# Patient Record
Sex: Female | Born: 1960
Health system: Southern US, Community
[De-identification: ages and names within clinical notes are randomized; demographics above are authoritative.]

## PROBLEM LIST (undated history)

## (undated) DIAGNOSIS — E7800 Pure hypercholesterolemia, unspecified: Secondary | ICD-10-CM

## (undated) DIAGNOSIS — I639 Cerebral infarction, unspecified: Secondary | ICD-10-CM

## (undated) DIAGNOSIS — J4520 Mild intermittent asthma, uncomplicated: Secondary | ICD-10-CM

## (undated) DIAGNOSIS — I10 Essential (primary) hypertension: Secondary | ICD-10-CM

## (undated) DIAGNOSIS — M1990 Unspecified osteoarthritis, unspecified site: Secondary | ICD-10-CM

## (undated) DIAGNOSIS — G4730 Sleep apnea, unspecified: Secondary | ICD-10-CM

## (undated) DIAGNOSIS — L509 Urticaria, unspecified: Secondary | ICD-10-CM

## (undated) DIAGNOSIS — K219 Gastro-esophageal reflux disease without esophagitis: Secondary | ICD-10-CM

## (undated) DIAGNOSIS — E079 Disorder of thyroid, unspecified: Secondary | ICD-10-CM

## (undated) DIAGNOSIS — M549 Dorsalgia, unspecified: Secondary | ICD-10-CM

## (undated) HISTORY — PX: KNEE SURGERY: SHX244

## (undated) HISTORY — DX: Urticaria, unspecified: L50.9

## (undated) HISTORY — DX: Mild intermittent asthma, uncomplicated: J45.20

## (undated) HISTORY — PX: ABDOMINAL HYSTERECTOMY: SHX81

## (undated) HISTORY — PX: CARPAL TUNNEL RELEASE: SHX101

## (undated) HISTORY — PX: NASAL SINUS SURGERY: SHX719

---

## 2006-04-07 ENCOUNTER — Encounter: Admission: RE | Admit: 2006-04-07 | Discharge: 2006-04-07 | Payer: Self-pay | Admitting: Internal Medicine

## 2008-03-02 ENCOUNTER — Emergency Department (HOSPITAL_BASED_OUTPATIENT_CLINIC_OR_DEPARTMENT_OTHER): Admission: EM | Admit: 2008-03-02 | Discharge: 2008-03-02 | Payer: Self-pay | Admitting: Emergency Medicine

## 2008-03-30 ENCOUNTER — Emergency Department (HOSPITAL_BASED_OUTPATIENT_CLINIC_OR_DEPARTMENT_OTHER): Admission: EM | Admit: 2008-03-30 | Discharge: 2008-03-30 | Payer: Self-pay | Admitting: Emergency Medicine

## 2008-04-07 ENCOUNTER — Emergency Department (HOSPITAL_BASED_OUTPATIENT_CLINIC_OR_DEPARTMENT_OTHER): Admission: EM | Admit: 2008-04-07 | Discharge: 2008-04-07 | Payer: Self-pay | Admitting: Emergency Medicine

## 2008-05-26 ENCOUNTER — Emergency Department (HOSPITAL_BASED_OUTPATIENT_CLINIC_OR_DEPARTMENT_OTHER): Admission: EM | Admit: 2008-05-26 | Discharge: 2008-05-26 | Payer: Self-pay | Admitting: Emergency Medicine

## 2008-07-30 ENCOUNTER — Emergency Department (HOSPITAL_BASED_OUTPATIENT_CLINIC_OR_DEPARTMENT_OTHER): Admission: EM | Admit: 2008-07-30 | Discharge: 2008-07-30 | Payer: Self-pay | Admitting: Emergency Medicine

## 2008-09-08 ENCOUNTER — Ambulatory Visit: Payer: Self-pay | Admitting: Radiology

## 2008-09-08 ENCOUNTER — Emergency Department (HOSPITAL_BASED_OUTPATIENT_CLINIC_OR_DEPARTMENT_OTHER): Admission: EM | Admit: 2008-09-08 | Discharge: 2008-09-08 | Payer: Self-pay | Admitting: Emergency Medicine

## 2009-01-12 ENCOUNTER — Emergency Department (HOSPITAL_BASED_OUTPATIENT_CLINIC_OR_DEPARTMENT_OTHER): Admission: EM | Admit: 2009-01-12 | Discharge: 2009-01-12 | Payer: Self-pay | Admitting: Emergency Medicine

## 2009-01-12 ENCOUNTER — Ambulatory Visit: Payer: Self-pay | Admitting: Diagnostic Radiology

## 2009-02-17 ENCOUNTER — Emergency Department (HOSPITAL_BASED_OUTPATIENT_CLINIC_OR_DEPARTMENT_OTHER): Admission: EM | Admit: 2009-02-17 | Discharge: 2009-02-17 | Payer: Self-pay | Admitting: Emergency Medicine

## 2009-03-22 ENCOUNTER — Emergency Department (HOSPITAL_COMMUNITY): Admission: EM | Admit: 2009-03-22 | Discharge: 2009-03-22 | Payer: Self-pay | Admitting: Emergency Medicine

## 2009-05-11 ENCOUNTER — Emergency Department (HOSPITAL_BASED_OUTPATIENT_CLINIC_OR_DEPARTMENT_OTHER): Admission: EM | Admit: 2009-05-11 | Discharge: 2009-05-11 | Payer: Self-pay | Admitting: Emergency Medicine

## 2009-05-11 ENCOUNTER — Ambulatory Visit: Payer: Self-pay | Admitting: Diagnostic Radiology

## 2009-07-04 ENCOUNTER — Emergency Department (HOSPITAL_BASED_OUTPATIENT_CLINIC_OR_DEPARTMENT_OTHER): Admission: EM | Admit: 2009-07-04 | Discharge: 2009-07-04 | Payer: Self-pay | Admitting: Emergency Medicine

## 2009-07-04 ENCOUNTER — Ambulatory Visit: Payer: Self-pay | Admitting: Diagnostic Radiology

## 2009-07-17 ENCOUNTER — Ambulatory Visit (HOSPITAL_BASED_OUTPATIENT_CLINIC_OR_DEPARTMENT_OTHER): Admission: RE | Admit: 2009-07-17 | Discharge: 2009-07-17 | Payer: Self-pay | Admitting: Orthopedic Surgery

## 2009-07-17 ENCOUNTER — Ambulatory Visit: Payer: Self-pay | Admitting: Diagnostic Radiology

## 2009-07-30 ENCOUNTER — Encounter: Admission: RE | Admit: 2009-07-30 | Discharge: 2009-10-03 | Payer: Self-pay | Admitting: Orthopedic Surgery

## 2010-05-03 ENCOUNTER — Emergency Department (HOSPITAL_BASED_OUTPATIENT_CLINIC_OR_DEPARTMENT_OTHER): Admission: EM | Admit: 2010-05-03 | Discharge: 2010-05-03 | Payer: Self-pay | Admitting: Emergency Medicine

## 2010-11-03 LAB — POCT CARDIAC MARKERS
CKMB, poc: <1 ng/mL — ABNORMAL LOW (ref 1.0–8.0)
Myoglobin, poc: 104 ng/mL (ref 12–200)
Myoglobin, poc: 131 ng/mL (ref 12–200)
Troponin i, poc: <0.05 ng/mL (ref 0.00–0.09)
Troponin i, poc: <0.05 ng/mL (ref 0.00–0.09)

## 2010-11-03 LAB — DIFFERENTIAL
Basophils Relative: 1 % (ref 0–1)
Lymphs Abs: 2.1 10*3/uL (ref 0.7–4.0)
Monocytes Absolute: 0.6 10*3/uL (ref 0.1–1.0)
Monocytes Relative: 7 % (ref 3–12)
Neutro Abs: 5.3 10*3/uL (ref 1.7–7.7)
Neutrophils Relative %: 66 % (ref 43–77)

## 2010-11-03 LAB — BASIC METABOLIC PANEL
GFR calc Af Amer: >60 mL/min (ref >60)
Potassium: 3.7 mEq/L (ref 3.5–5.1)

## 2010-11-03 LAB — CBC: Hemoglobin: 12.7 g/dL (ref 12.0–15.0)

## 2010-11-11 LAB — CBC
HCT: 35.8 % — ABNORMAL LOW (ref 36.0–46.0)
Hemoglobin: 11.9 g/dL — ABNORMAL LOW (ref 12.0–15.0)
MCHC: 33.2 g/dL (ref 30.0–36.0)
MCV: 84 fL (ref 78.0–100.0)
Platelets: 248 10*3/uL (ref 150–400)
RDW: 13.7 % (ref 11.5–15.5)
WBC: 6.8 10*3/uL (ref 4.0–10.5)

## 2010-11-11 LAB — BASIC METABOLIC PANEL
BUN: 18 mg/dL (ref 6–23)
CO2: 27 mEq/L (ref 19–32)
Chloride: 104 mEq/L (ref 96–112)
GFR calc non Af Amer: 48 mL/min — ABNORMAL LOW (ref >60)
Glucose, Bld: 91 mg/dL (ref 70–99)

## 2011-01-23 ENCOUNTER — Emergency Department (HOSPITAL_BASED_OUTPATIENT_CLINIC_OR_DEPARTMENT_OTHER)
Admission: EM | Admit: 2011-01-23 | Discharge: 2011-01-23 | Disposition: A | Discharge status: Home / Self Care | Payer: Medicare Other | Attending: Emergency Medicine | Admitting: Emergency Medicine

## 2011-01-23 ENCOUNTER — Emergency Department (INDEPENDENT_AMBULATORY_CARE_PROVIDER_SITE_OTHER): Payer: Medicare Other

## 2011-01-23 DIAGNOSIS — R0602 Shortness of breath: Secondary | ICD-10-CM | POA: Insufficient documentation

## 2011-01-23 DIAGNOSIS — I10 Essential (primary) hypertension: Secondary | ICD-10-CM | POA: Insufficient documentation

## 2011-01-23 DIAGNOSIS — J329 Chronic sinusitis, unspecified: Secondary | ICD-10-CM | POA: Insufficient documentation

## 2011-01-23 DIAGNOSIS — R062 Wheezing: Secondary | ICD-10-CM

## 2011-01-23 DIAGNOSIS — E039 Hypothyroidism, unspecified: Secondary | ICD-10-CM | POA: Insufficient documentation

## 2011-01-23 DIAGNOSIS — E785 Hyperlipidemia, unspecified: Secondary | ICD-10-CM | POA: Insufficient documentation

## 2011-01-23 DIAGNOSIS — R05 Cough: Secondary | ICD-10-CM

## 2011-01-23 DIAGNOSIS — I517 Cardiomegaly: Secondary | ICD-10-CM

## 2011-01-23 DIAGNOSIS — Z79899 Other long term (current) drug therapy: Secondary | ICD-10-CM | POA: Insufficient documentation

## 2011-01-23 DIAGNOSIS — R0789 Other chest pain: Secondary | ICD-10-CM

## 2011-01-23 LAB — RAPID STREP SCREEN (MED CTR MEBANE ONLY): Streptococcus, Group A Screen (Direct): NEGATIVE

## 2011-03-07 ENCOUNTER — Encounter: Payer: Self-pay | Admitting: *Deleted

## 2011-03-07 ENCOUNTER — Emergency Department (INDEPENDENT_AMBULATORY_CARE_PROVIDER_SITE_OTHER): Payer: Medicare Other

## 2011-03-07 ENCOUNTER — Emergency Department (HOSPITAL_BASED_OUTPATIENT_CLINIC_OR_DEPARTMENT_OTHER)
Admission: EM | Admit: 2011-03-07 | Discharge: 2011-03-07 | Disposition: A | Discharge status: Home / Self Care | Payer: Medicare Other | Attending: Emergency Medicine | Admitting: Emergency Medicine

## 2011-03-07 DIAGNOSIS — I10 Essential (primary) hypertension: Secondary | ICD-10-CM | POA: Insufficient documentation

## 2011-03-07 DIAGNOSIS — S1093XA Contusion of unspecified part of neck, initial encounter: Secondary | ICD-10-CM | POA: Insufficient documentation

## 2011-03-07 DIAGNOSIS — S0003XA Contusion of scalp, initial encounter: Secondary | ICD-10-CM | POA: Insufficient documentation

## 2011-03-07 DIAGNOSIS — E7800 Pure hypercholesterolemia, unspecified: Secondary | ICD-10-CM | POA: Insufficient documentation

## 2011-03-07 DIAGNOSIS — IMO0002 Reserved for concepts with insufficient information to code with codable children: Secondary | ICD-10-CM | POA: Insufficient documentation

## 2011-03-07 DIAGNOSIS — G4730 Sleep apnea, unspecified: Secondary | ICD-10-CM | POA: Insufficient documentation

## 2011-03-07 DIAGNOSIS — R51 Headache: Secondary | ICD-10-CM

## 2011-03-07 DIAGNOSIS — Z8679 Personal history of other diseases of the circulatory system: Secondary | ICD-10-CM | POA: Insufficient documentation

## 2011-03-07 DIAGNOSIS — J3489 Other specified disorders of nose and nasal sinuses: Secondary | ICD-10-CM

## 2011-03-07 DIAGNOSIS — S0083XA Contusion of other part of head, initial encounter: Secondary | ICD-10-CM

## 2011-03-07 DIAGNOSIS — E079 Disorder of thyroid, unspecified: Secondary | ICD-10-CM | POA: Insufficient documentation

## 2011-03-07 HISTORY — DX: Cerebral infarction, unspecified: I63.9

## 2011-03-07 HISTORY — DX: Dorsalgia, unspecified: M54.9

## 2011-03-07 HISTORY — DX: Pure hypercholesterolemia, unspecified: E78.00

## 2011-03-07 HISTORY — DX: Sleep apnea, unspecified: G47.30

## 2011-03-07 HISTORY — DX: Disorder of thyroid, unspecified: E07.9

## 2011-03-07 HISTORY — DX: Essential (primary) hypertension: I10

## 2011-03-07 NOTE — ED Provider Notes (Signed)
History    the patient presents for evaluation of facial injury sustained yesterday when she accidentally opened a card or into her face hitting her nose on the bridge of the nose. She was concerned because she's had had sinus surgery in the past and wanted to make sure there was no fracture. She reports a sensation of "fullness" behind her nose and maxillary sinuses. She is breathing through her nose without difficulty and she denies headache. She has no ecchymosis or deformity evident about the face. She denies neck pain or other injury.  CSN: [id number] Arrival date & time: 03/07/2011  4:59 PM  Chief Complaint  Patient presents with  . Facial Injury   Patient is a 50 y.o. female presenting with facial injury. The history is provided by the patient.  Facial Injury  The incident occurred yesterday. The incident occurred at home. The injury mechanism was a direct blow. The wounds were self-inflicted. Pertinent negatives include no chest pain, no numbness, no visual disturbance, no headaches, no hearing loss, no neck pain, no light-headedness, no loss of consciousness, no tingling, no cough and no difficulty breathing. She has been behaving normally.    Past Medical History  Diagnosis Date  . Thyroid disease   . Hypertension   . Stroke   . Back pain   . Hypercholesteremia   . Sleep apnea     Past Surgical History  Procedure Date  . Nasal sinus surgery   . Abdominal hysterectomy   . Carpal tunnel release   . Nasal sinus surgery     History reviewed. No pertinent family history.  History  Substance Use Topics  . Smoking status: Never Smoker   . Smokeless tobacco: Not on file  . Alcohol Use: No    OB History    Grav Para Term Preterm Abortions TAB SAB Ect Mult Living                  Review of Systems  Constitutional: Negative.   HENT: Positive for sinus pressure. Negative for hearing loss, ear pain, nosebleeds, congestion, facial swelling, rhinorrhea, neck pain, neck  stiffness, dental problem, postnasal drip, tinnitus and ear discharge.   Eyes: Negative.  Negative for visual disturbance.  Respiratory: Negative.  Negative for cough.   Cardiovascular: Negative.  Negative for chest pain.  Musculoskeletal: Negative for back pain.  Skin: Negative for rash and wound.  Neurological: Negative for tingling, loss of consciousness, light-headedness, numbness and headaches.  Psychiatric/Behavioral: Negative.     Physical Exam  BP 140/71  Pulse 56  Temp(Src) 98 F (36.7 C) (Oral)  Resp 20  Ht 5' 2.5" (1.588 m)  Wt 186 lb (84.369 kg)  BMI 33.48 kg/m2  SpO2 99%  Physical Exam  Nursing note and vitals reviewed. Constitutional: She is oriented to person, place, and time. She appears well-developed and well-nourished. No distress.  HENT:  Head: Normocephalic and atraumatic.  Right Ear: External ear normal.  Left Ear: External ear normal.  Nose: Sinus tenderness present. No mucosal edema, rhinorrhea, nose lacerations, nasal deformity, septal deviation or nasal septal hematoma. No epistaxis. Right sinus exhibits maxillary sinus tenderness. Left sinus exhibits maxillary sinus tenderness.    Mouth/Throat: Oropharynx is clear and moist.  Eyes: Conjunctivae and EOM are normal. Pupils are equal, round, and reactive to light. Right eye exhibits no discharge. Left eye exhibits no discharge.  Neck: Normal range of motion. Neck supple.  Cardiovascular: Normal rate and regular rhythm.   Pulmonary/Chest: Effort normal and breath sounds  normal.  Musculoskeletal: Normal range of motion. She exhibits no tenderness.       Cervical back: Normal. She exhibits no tenderness, no bony tenderness, no deformity and no pain.  Neurological: She is alert and oriented to person, place, and time. No cranial nerve deficit. Coordination normal.  Skin: Skin is warm and dry. No rash noted. No erythema.  Psychiatric: She has a normal mood and affect. Her behavior is normal. Judgment and  thought content normal.    ED Course  Procedures  MDM Facial contusion, facial fracture   SHELDON,CHARLES B. Xray neg. Pt advised to followup with ENT after swelling has gone down for any cosmetic or breathing concerns.   Charles B. Bernette Mayers, MD 03/07/11 2132

## 2011-03-07 NOTE — ED Notes (Signed)
Pt states she somehow hit herself in the nose with the cardoor yesterday.

## 2011-03-30 ENCOUNTER — Emergency Department (HOSPITAL_BASED_OUTPATIENT_CLINIC_OR_DEPARTMENT_OTHER)
Admission: EM | Admit: 2011-03-30 | Discharge: 2011-03-30 | Disposition: A | Discharge status: Home / Self Care | Payer: Medicare Other | Attending: Emergency Medicine | Admitting: Emergency Medicine

## 2011-03-30 ENCOUNTER — Encounter (HOSPITAL_BASED_OUTPATIENT_CLINIC_OR_DEPARTMENT_OTHER): Payer: Self-pay | Admitting: *Deleted

## 2011-03-30 DIAGNOSIS — Z8679 Personal history of other diseases of the circulatory system: Secondary | ICD-10-CM | POA: Insufficient documentation

## 2011-03-30 DIAGNOSIS — E7800 Pure hypercholesterolemia, unspecified: Secondary | ICD-10-CM | POA: Insufficient documentation

## 2011-03-30 DIAGNOSIS — T364X5A Adverse effect of tetracyclines, initial encounter: Secondary | ICD-10-CM | POA: Insufficient documentation

## 2011-03-30 DIAGNOSIS — G4730 Sleep apnea, unspecified: Secondary | ICD-10-CM | POA: Insufficient documentation

## 2011-03-30 DIAGNOSIS — E039 Hypothyroidism, unspecified: Secondary | ICD-10-CM | POA: Insufficient documentation

## 2011-03-30 DIAGNOSIS — E079 Disorder of thyroid, unspecified: Secondary | ICD-10-CM | POA: Insufficient documentation

## 2011-03-30 DIAGNOSIS — I10 Essential (primary) hypertension: Secondary | ICD-10-CM | POA: Insufficient documentation

## 2011-03-30 DIAGNOSIS — L03317 Cellulitis of buttock: Secondary | ICD-10-CM

## 2011-03-30 DIAGNOSIS — J3489 Other specified disorders of nose and nasal sinuses: Secondary | ICD-10-CM | POA: Insufficient documentation

## 2011-03-30 DIAGNOSIS — L0231 Cutaneous abscess of buttock: Secondary | ICD-10-CM | POA: Insufficient documentation

## 2011-03-30 MED ORDER — HYDROMORPHONE HCL 1 MG/ML IJ SOLN
INTRAMUSCULAR | Status: AC
  Filled 2011-03-30: qty 1

## 2011-03-30 MED ORDER — DOXYCYCLINE HYCLATE 100 MG PO CAPS: 100.0000 mg | ORAL_CAPSULE | Freq: Two times a day (BID) | ORAL | Status: AC

## 2011-03-30 MED ORDER — OXYCODONE-ACETAMINOPHEN 5-325 MG PO TABS
2.0000 | ORAL_TABLET | Freq: Once | ORAL | Status: AC
  Administered 2011-03-30: 2 via ORAL
  Filled 2011-03-30: qty 2

## 2011-03-30 MED ORDER — IBUPROFEN 600 MG PO TABS: 600.0000 mg | ORAL_TABLET | Freq: Three times a day (TID) | ORAL | Status: AC | PRN

## 2011-03-30 MED ORDER — HYDROMORPHONE HCL 1 MG/ML IJ SOLN
1.0000 mg | Freq: Once | INTRAMUSCULAR | Status: AC
  Administered 2011-03-30: 1 mg via INTRAMUSCULAR

## 2011-03-30 MED ORDER — LIDOCAINE-EPINEPHRINE 2 %-1:100000 IJ SOLN
INTRAMUSCULAR | Status: AC
  Administered 2011-03-30: 1 mL
  Filled 2011-03-30: qty 1

## 2011-03-30 MED ORDER — HYDROCODONE-ACETAMINOPHEN 5-325 MG PO TABS: 2.0000 | ORAL_TABLET | ORAL | Status: AC | PRN

## 2011-03-30 NOTE — ED Notes (Signed)
Pt seen this am treated for boil prescribed doxycycline got it filled attempting to take the first pill got strangled vomited states " vomit went out my nose my eyes and my throat are burning" pt concerned this is an allergic reaction to the med.pt states she never got the pill swallowed

## 2011-03-30 NOTE — ED Notes (Signed)
Pt with an abscess since Friday

## 2011-03-30 NOTE — ED Provider Notes (Addendum)
History     CSN: [id number] Arrival date & time: 03/30/2011 10:00 AM  Chief Complaint  Patient presents with  . Medication Reaction   HPI Patient complains of sinus pain. She was seen here last night and given prescription for doxycycline. She took the doxycycline. She felt like it caught in her throat. She then vomited. She states that she felt like she had a severe burning  sensation in her sinuses since that time. She has not had continued nausea and vomiting. It is unclear whether the pill was swallowed. She's had no previous reaction to any type of antibiotic medication. She states that she has had sinus surgeries in the past and is concerned that she is going to have a sinus infection because of this. Past Medical History  Diagnosis Date  . Thyroid disease   . Hypertension   . Stroke   . Back pain   . Hypercholesteremia   . Sleep apnea     Past Surgical History  Procedure Date  . Nasal sinus surgery   . Abdominal hysterectomy   . Carpal tunnel release   . Nasal sinus surgery     History reviewed. No pertinent family history.  History  Substance Use Topics  . Smoking status: Never Smoker   . Smokeless tobacco: Not on file  . Alcohol Use: No    OB History    Grav Para Term Preterm Abortions TAB SAB Ect Mult Living                  Review of Systems  All other systems reviewed and are negative.    Physical Exam  BP 134/79  Pulse 79  Temp(Src) 98.4 F (36.9 C) (Oral)  Resp 18  Wt 186 lb (84.369 kg)  SpO2 99%  Physical Exam  Vitals reviewed. Constitutional: She is oriented to person, place, and time. She appears well-developed and well-nourished.  HENT:  Head: Normocephalic and atraumatic.  Right Ear: External ear normal.  Left Ear: External ear normal.  Mouth/Throat: Oropharynx is clear and moist.       There is some hyperemic areas of the mucosa in the right nares with some clearish discharge. Left nares appears normal.  Eyes: Pupils are equal,  round, and reactive to light.  Neck: Normal range of motion.  Cardiovascular: Normal rate.   Pulmonary/Chest: Effort normal and breath sounds normal.  Abdominal: Soft. Bowel sounds are normal.  Neurological: She is alert and oriented to person, place, and time.  Skin: Skin is warm.  Psychiatric: She has a normal mood and affect.    ED Course  Procedures  MD I have discussed with the patient that she may have irritated the mucous membranes with the gastric contents of her stomach. She has admissions for a nasal rinse at home and will use this. She'll also stop at the drugstore and discuss getting a Nettie pot with the pharmacist. Advised her that rinsing out the sinus area will be as advised after his burning episode of possible gastric content exposure. She is to continue on the doxycycline. She is given a dose of Percocet here and will be given a prescription for 10 Percocet to be used as needed for pain.      Hilario Quarry, MD 03/30/11 1122  Hilario Quarry, MD 04/08/11 [phone]

## 2011-03-30 NOTE — ED Provider Notes (Signed)
History    the patient presents for evaluation and treatment of a skin abscess just adjacent to the gluteal cleft on the right buttock. She reports that she has had pain and swelling, tenderness there for 3 days. She denies any fever or drainage.  CSN: [id number] Arrival date & time: 03/30/2011  4:14 AM  Chief Complaint  Patient presents with  . Recurrent Skin Infections   Patient is a 50 y.o. female presenting with abscess. The history is provided by the patient.  Abscess  This is a new problem. The current episode started less than one week ago. The onset was gradual. The problem occurs continuously. The problem has been gradually worsening. The abscess is present on the right buttock. The problem is moderate. The abscess is characterized by redness, painfulness and swelling. Associated with: No exposure. The abscess first occurred at home. Pertinent negatives include no anorexia, no decrease in physical activity, not sleeping less, no fever and no diarrhea. Her past medical history does not include skin abscesses in family. There were no sick contacts. She has received no recent medical care.    Past Medical History  Diagnosis Date  . Thyroid disease   . Hypertension   . Stroke   . Back pain   . Hypercholesteremia   . Sleep apnea     Past Surgical History  Procedure Date  . Nasal sinus surgery   . Abdominal hysterectomy   . Carpal tunnel release   . Nasal sinus surgery     History reviewed. No pertinent family history.  History  Substance Use Topics  . Smoking status: Never Smoker   . Smokeless tobacco: Not on file  . Alcohol Use: No    OB History    Grav Para Term Preterm Abortions TAB SAB Ect Mult Living                  Review of Systems  Constitutional: Negative for fever.  HENT: Negative.   Eyes: Negative.   Respiratory: Negative.   Cardiovascular: Negative.   Gastrointestinal: Negative.  Negative for abdominal pain, diarrhea, constipation, abdominal  distention, anal bleeding, rectal pain and anorexia.  Musculoskeletal: Negative.   Skin: Positive for color change.  Hematological: Does not bruise/bleed easily.  Psychiatric/Behavioral: Negative.     Physical Exam  BP 112/65  Pulse 82  Temp(Src) 98.8 F (37.1 C) (Oral)  Resp 16  SpO2 100%  Physical Exam  Nursing note and vitals reviewed. Constitutional: She is oriented to person, place, and time. She appears well-nourished. She appears distressed.  HENT:  Head: Normocephalic and atraumatic.  Eyes: Pupils are equal, round, and reactive to light.  Cardiovascular: Normal rate and regular rhythm.   Pulmonary/Chest: Effort normal and breath sounds normal.  Abdominal: Soft. Bowel sounds are normal. She exhibits no distension. There is no tenderness. There is no rebound and no guarding.  Genitourinary:       At the medial aspect of the right buttock just adjacent to the gluteal cleft, there is an approximately 2 cm x 1 cm area of erythema, induration, tenderness to palpation, with underlying fluctuance suggestive of an abscess with adjacent cellulitis.  Musculoskeletal: Normal range of motion.  Neurological: She is alert and oriented to person, place, and time.  Skin: Skin is warm and dry. No rash noted. She is not diaphoretic. There is erythema.  Psychiatric: She has a normal mood and affect. Her behavior is normal. Judgment and thought content normal.    ED Course  INCISION  AND DRAINAGE Date/Time: 03/30/2011 6:07 AM Performed by: Kennon Rounds D Authorized by: Kennon Rounds D Consent: Verbal consent obtained. Written consent not obtained. Risks and benefits: risks, benefits and alternatives were discussed Consent given by: patient Patient understanding: patient states understanding of the procedure being performed Patient consent: the patient's understanding of the procedure matches consent given Procedure consent: procedure consent matches procedure scheduled Relevant  documents: relevant documents present and verified Required items: required blood products, implants, devices, and special equipment available Patient identity confirmed: verbally with patient and arm band Time out: Immediately prior to procedure a "time out" was called to verify the correct patient, procedure, equipment, support staff and site/side marked as required. Type: abscess Body area: anogenital Location details: gluteal cleft Anesthesia: local infiltration Local anesthetic: lidocaine 2% without epinephrine Anesthetic total: 2 ml Patient sedated: no Scalpel size: 11 Incision type: single straight Complexity: simple Drainage: purulent Drainage amount: moderate Wound treatment: wound left open and drain placed Packing material: 1/4 in iodoform gauze Patient tolerance: Patient tolerated the procedure well with no immediate complications.    MDM Abscess with overlying cellulitis treated with incision and drainage.      Felisa Bonier, MD 03/30/11 [phone]

## 2011-05-25 ENCOUNTER — Emergency Department (HOSPITAL_BASED_OUTPATIENT_CLINIC_OR_DEPARTMENT_OTHER)
Admission: EM | Admit: 2011-05-25 | Discharge: 2011-05-25 | Disposition: A | Discharge status: Home / Self Care | Payer: Medicare Other | Attending: Emergency Medicine | Admitting: Emergency Medicine

## 2011-05-25 ENCOUNTER — Encounter (HOSPITAL_BASED_OUTPATIENT_CLINIC_OR_DEPARTMENT_OTHER): Payer: Self-pay | Admitting: *Deleted

## 2011-05-25 DIAGNOSIS — L089 Local infection of the skin and subcutaneous tissue, unspecified: Secondary | ICD-10-CM | POA: Insufficient documentation

## 2011-05-25 DIAGNOSIS — E079 Disorder of thyroid, unspecified: Secondary | ICD-10-CM | POA: Insufficient documentation

## 2011-05-25 DIAGNOSIS — L0293 Carbuncle, unspecified: Secondary | ICD-10-CM | POA: Insufficient documentation

## 2011-05-25 DIAGNOSIS — G4730 Sleep apnea, unspecified: Secondary | ICD-10-CM | POA: Insufficient documentation

## 2011-05-25 DIAGNOSIS — L0292 Furuncle, unspecified: Secondary | ICD-10-CM | POA: Insufficient documentation

## 2011-05-25 DIAGNOSIS — E7800 Pure hypercholesterolemia, unspecified: Secondary | ICD-10-CM | POA: Insufficient documentation

## 2011-05-25 HISTORY — DX: Unspecified osteoarthritis, unspecified site: M19.90

## 2011-05-25 MED ORDER — HYDROCODONE-ACETAMINOPHEN 5-325 MG PO TABS: 1.0000 | ORAL_TABLET | Freq: Four times a day (QID) | ORAL | Status: AC | PRN

## 2011-05-25 MED ORDER — HYDROCODONE-ACETAMINOPHEN 5-325 MG PO TABS
1.0000 | ORAL_TABLET | Freq: Once | ORAL | Status: AC
  Administered 2011-05-25: 1 via ORAL
  Filled 2011-05-25: qty 1

## 2011-05-25 MED ORDER — DOXYCYCLINE HYCLATE 100 MG PO TABS
100.0000 mg | ORAL_TABLET | Freq: Once | ORAL | Status: AC
  Administered 2011-05-25: 100 mg via ORAL
  Filled 2011-05-25: qty 1

## 2011-05-25 MED ORDER — DOXYCYCLINE HYCLATE 100 MG PO CAPS: 100.0000 mg | ORAL_CAPSULE | Freq: Two times a day (BID) | ORAL | Status: AC

## 2011-05-25 NOTE — ED Notes (Signed)
Pt has "boil" to left lower leg. Pt first noticed 2days ago. Pt drained the boil yesterday, but states the pain is worsening. Mild redness noted around the site. Tender to touch. Hx of same a couple months ago. Had I&D with doxy RX. Resolved.

## 2011-05-25 NOTE — ED Provider Notes (Signed)
History    Scribed for Tiffany Jakes, MD, the patient was seen in room MH09/MH09. This chart was scribed by Katha Cabal.   CSN: [id number] Arrival date & time: 05/25/2011  7:15 PM   First MD Initiated Contact with Patient 05/25/11 2016      Chief Complaint  Patient presents with  . Recurrent Skin Infections    (Consider location/radiation/quality/duration/timing/severity/associated sxs/prior treatment) HPI Tiffany Larsen is a 50 y.o. female who presents to the Emergency Department complaining of persistent moderate boil to left lower leg with associated site pain that began 2 days ago.  Sx are associated with nausea.  Denies headache, neck pain, vomiting, SOB, chest pain, abdominal pain and rash.  Patient drained boil yesterday.  Patient has no taken anything for pain.  Walking and bearing weight on left leg aggravates sx.  Patient reports similar sx a couple months ago that was lanced and packed. Patient was prescribed doxycycline.   Tetanus shots are UTD.  Patient does not have hx of DM.   PCP Jackie Plum, MD, MD   Past Medical History  Diagnosis Date  . Thyroid disease   . Hypertension   . Stroke   . Back pain   . Hypercholesteremia   . Sleep apnea   . Arthritis     Past Surgical History  Procedure Date  . Nasal sinus surgery   . Abdominal hysterectomy   . Carpal tunnel release   . Nasal sinus surgery     No family history on file.  History  Substance Use Topics  . Smoking status: Never Smoker   . Smokeless tobacco: Not on file  . Alcohol Use: No    OB History    Grav Para Term Preterm Abortions TAB SAB Ect Mult Living                  Review of Systems 10 Systems reviewed and are negative for acute change except as noted in the HPI.  Allergies  Review of patient's allergies indicates no known allergies.  Home Medications   Current Outpatient Rx  Name Route Sig Dispense Refill  . ATENOLOL-CHLORTHALIDONE 50-25 MG PO TABS Oral Take 1  tablet by mouth daily.      Marland Kitchen BUTALBITAL-APAP-CAFFEINE 50-325-40 MG PO TABS Oral Take 1 tablet by mouth every 4 (four) hours as needed. For headache     . ESOMEPRAZOLE MAGNESIUM 40 MG PO CPDR Oral Take 40 mg by mouth daily before breakfast.      . LEVOTHYROXINE SODIUM 88 MCG PO TABS Oral Take 88 mcg by mouth daily.      Marland Kitchen LISINOPRIL 10 MG PO TABS Oral Take 10 mg by mouth daily.      Marland Kitchen NORTRIPTYLINE HCL 25 MG PO CAPS Oral Take 25 mg by mouth at bedtime.      Marland Kitchen POTASSIUM CHLORIDE CRYS CR 20 MEQ PO TBCR Oral Take 20 mEq by mouth daily.      Marland Kitchen PRAVASTATIN SODIUM 40 MG PO TABS Oral Take 40 mg by mouth daily.      . ALBUTEROL 90 MCG/ACT IN AERS Inhalation Inhale 2 puffs into the lungs once as needed. For shortness of breath     . HYDROCODONE-IBUPROFEN 7.5-200 MG PO TABS Oral Take 1 tablet by mouth every 8 (eight) hours as needed. For back pain     . LEVOTHYROXINE SODIUM 75 MCG PO TABS Oral Take 75 mcg by mouth daily.        BP 131/71  Pulse 69  Temp(Src) 98.2 F (36.8 C) (Oral)  Resp 18  Ht 5\' 2"  (1.575 m)  SpO2 99%  Physical Exam  Nursing note and vitals reviewed. Constitutional: She is oriented to person, place, and time. She appears well-developed and well-nourished.  HENT:  Head: Normocephalic and atraumatic.  Mouth/Throat: Oropharynx is clear and moist.  Eyes: Pupils are equal, round, and reactive to light.  Neck: Neck supple.  Cardiovascular: Normal rate, regular rhythm and normal heart sounds.   No murmur heard. Pulmonary/Chest: Effort normal and breath sounds normal. No respiratory distress. She has no wheezes.  Abdominal: Soft. Bowel sounds are normal. There is no tenderness. There is no rebound and no guarding.  Musculoskeletal: Normal range of motion. She exhibits no edema.  Neurological: She is alert and oriented to person, place, and time. No cranial nerve deficit or sensory deficit.  Skin: Skin is warm and dry. No rash noted.       1 cm boil with 2 mm area pustular in  center, 3 cm induration and 4 cm surrounding redness, site tender to palpation    Psychiatric: She has a normal mood and affect. Her behavior is normal.    ED Course  Procedures (including critical care time)   DIAGNOSTIC STUDIES: Oxygen Saturation is 100% on room air, normal by my interpretation.    COORDINATION OF CARE:  9:19 PM  Physical exam complete.  Will prescribe antibiotic and discharge patient. Patient agrees with plan.    LABS / RADIOLOGY:   Labs Reviewed - No data to display No results found.       MDM   MDM:  Findings consistent with small boil abscess of left anterior shin. Should be amendable to by mouth antibiotics at this time. Not ready for I&D at this point in time. Has a good chance that'll come under control which is by mouth antibiotics.     MEDICATIONS GIVEN IN THE E.D. Scheduled Meds:   Continuous Infusions:      IMPRESSION: Abscess boil of left shin.    I personally performed the services described in this documentation, which was scribed in my presence. The recorded information has been reviewed and considered.            Tiffany Jakes, MD 05/25/11 2154

## 2011-05-25 NOTE — ED Notes (Signed)
Dr. Zackowski at bedside  

## 2011-07-13 ENCOUNTER — Encounter (HOSPITAL_BASED_OUTPATIENT_CLINIC_OR_DEPARTMENT_OTHER): Payer: Self-pay | Admitting: *Deleted

## 2011-07-13 ENCOUNTER — Emergency Department (HOSPITAL_BASED_OUTPATIENT_CLINIC_OR_DEPARTMENT_OTHER)
Admission: EM | Admit: 2011-07-13 | Discharge: 2011-07-13 | Disposition: A | Discharge status: Home / Self Care | Payer: No Typology Code available for payment source | Attending: Emergency Medicine | Admitting: Emergency Medicine

## 2011-07-13 DIAGNOSIS — I10 Essential (primary) hypertension: Secondary | ICD-10-CM | POA: Insufficient documentation

## 2011-07-13 DIAGNOSIS — E079 Disorder of thyroid, unspecified: Secondary | ICD-10-CM | POA: Insufficient documentation

## 2011-07-13 DIAGNOSIS — M542 Cervicalgia: Secondary | ICD-10-CM | POA: Insufficient documentation

## 2011-07-13 DIAGNOSIS — Y92410 Unspecified street and highway as the place of occurrence of the external cause: Secondary | ICD-10-CM | POA: Insufficient documentation

## 2011-07-13 DIAGNOSIS — E7800 Pure hypercholesterolemia, unspecified: Secondary | ICD-10-CM | POA: Insufficient documentation

## 2011-07-13 DIAGNOSIS — Z79899 Other long term (current) drug therapy: Secondary | ICD-10-CM | POA: Insufficient documentation

## 2011-07-13 DIAGNOSIS — M546 Pain in thoracic spine: Secondary | ICD-10-CM | POA: Insufficient documentation

## 2011-07-13 DIAGNOSIS — G4730 Sleep apnea, unspecified: Secondary | ICD-10-CM | POA: Insufficient documentation

## 2011-07-13 MED ORDER — IBUPROFEN 800 MG PO TABS: 800.0000 mg | ORAL_TABLET | Freq: Three times a day (TID) | ORAL | Status: AC

## 2011-07-13 MED ORDER — IBUPROFEN 800 MG PO TABS
800.0000 mg | ORAL_TABLET | Freq: Once | ORAL | Status: AC
  Administered 2011-07-13: 800 mg via ORAL
  Filled 2011-07-13: qty 1

## 2011-07-13 MED ORDER — IBUPROFEN 800 MG PO TABS: 800.0000 mg | ORAL_TABLET | Freq: Once | ORAL | Status: DC

## 2011-07-13 MED ORDER — DIAZEPAM 5 MG PO TABS: 5.0000 mg | ORAL_TABLET | Freq: Every day | ORAL | Status: AC

## 2011-07-13 MED ORDER — DIAZEPAM 5 MG PO TABS
5.0000 mg | ORAL_TABLET | Freq: Once | ORAL | Status: AC
  Administered 2011-07-13: 5 mg via ORAL
  Filled 2011-07-13: qty 1
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
## 2011-07-24 ENCOUNTER — Emergency Department (HOSPITAL_BASED_OUTPATIENT_CLINIC_OR_DEPARTMENT_OTHER)
Admission: EM | Admit: 2011-07-24 | Discharge: 2011-07-24 | Disposition: A | Discharge status: Home / Self Care | Payer: No Typology Code available for payment source | Attending: Emergency Medicine | Admitting: Emergency Medicine

## 2011-07-24 ENCOUNTER — Emergency Department (INDEPENDENT_AMBULATORY_CARE_PROVIDER_SITE_OTHER): Payer: No Typology Code available for payment source

## 2011-07-24 ENCOUNTER — Encounter (HOSPITAL_BASED_OUTPATIENT_CLINIC_OR_DEPARTMENT_OTHER): Payer: Self-pay

## 2011-07-24 DIAGNOSIS — E079 Disorder of thyroid, unspecified: Secondary | ICD-10-CM | POA: Insufficient documentation

## 2011-07-24 DIAGNOSIS — M549 Dorsalgia, unspecified: Secondary | ICD-10-CM | POA: Insufficient documentation

## 2011-07-24 DIAGNOSIS — Z79899 Other long term (current) drug therapy: Secondary | ICD-10-CM | POA: Insufficient documentation

## 2011-07-24 DIAGNOSIS — I10 Essential (primary) hypertension: Secondary | ICD-10-CM | POA: Insufficient documentation

## 2011-07-24 DIAGNOSIS — E7800 Pure hypercholesterolemia, unspecified: Secondary | ICD-10-CM | POA: Insufficient documentation

## 2011-07-24 DIAGNOSIS — K219 Gastro-esophageal reflux disease without esophagitis: Secondary | ICD-10-CM | POA: Insufficient documentation

## 2011-07-24 DIAGNOSIS — M5380 Other specified dorsopathies, site unspecified: Secondary | ICD-10-CM

## 2011-07-24 DIAGNOSIS — G4730 Sleep apnea, unspecified: Secondary | ICD-10-CM | POA: Insufficient documentation

## 2011-07-24 DIAGNOSIS — Z8679 Personal history of other diseases of the circulatory system: Secondary | ICD-10-CM | POA: Insufficient documentation

## 2011-07-24 DIAGNOSIS — M4120 Other idiopathic scoliosis, site unspecified: Secondary | ICD-10-CM

## 2011-07-24 HISTORY — DX: Gastro-esophageal reflux disease without esophagitis: K21.9

## 2011-07-24 MED ORDER — CYCLOBENZAPRINE HCL 5 MG PO TABS: 5.0000 mg | ORAL_TABLET | Freq: Two times a day (BID) | ORAL | Status: AC | PRN

## 2011-07-24 MED ORDER — HYDROCODONE-ACETAMINOPHEN 5-500 MG PO TABS: 1.0000 | ORAL_TABLET | Freq: Four times a day (QID) | ORAL | Status: AC | PRN

## 2011-07-24 MED ORDER — CYCLOBENZAPRINE HCL 10 MG PO TABS
5.0000 mg | ORAL_TABLET | Freq: Once | ORAL | Status: AC
  Administered 2011-07-24: 5 mg via ORAL
  Filled 2011-07-24: qty 1

## 2011-07-24 MED ORDER — HYDROCODONE-ACETAMINOPHEN 5-325 MG PO TABS
1.0000 | ORAL_TABLET | Freq: Once | ORAL | Status: AC
  Administered 2011-07-24: 1 via ORAL
  Filled 2011-07-24: qty 1
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
## 2011-07-24 NOTE — ED Notes (Signed)
Pt reports onset of back pain today after picking up her granddaughter.

## 2011-07-26 NOTE — ED Provider Notes (Signed)
History/physical exam/procedure(s) were performed by non-physician practitioner and as supervising physician I was immediately available for consultation/collaboration. I have reviewed all notes and am in agreement with care and plan.   Xane Amsden S Leasia Swann, MD 07/26/11 1851 

## 2011-09-14 ENCOUNTER — Emergency Department (HOSPITAL_BASED_OUTPATIENT_CLINIC_OR_DEPARTMENT_OTHER)
Admission: EM | Admit: 2011-09-14 | Discharge: 2011-09-14 | Disposition: A | Discharge status: Home / Self Care | Payer: Medicare Other | Attending: Emergency Medicine | Admitting: Emergency Medicine

## 2011-09-14 ENCOUNTER — Encounter (HOSPITAL_BASED_OUTPATIENT_CLINIC_OR_DEPARTMENT_OTHER): Payer: Self-pay | Admitting: *Deleted

## 2011-09-14 DIAGNOSIS — I10 Essential (primary) hypertension: Secondary | ICD-10-CM | POA: Insufficient documentation

## 2011-09-14 DIAGNOSIS — R197 Diarrhea, unspecified: Secondary | ICD-10-CM | POA: Insufficient documentation

## 2011-09-14 DIAGNOSIS — E079 Disorder of thyroid, unspecified: Secondary | ICD-10-CM | POA: Insufficient documentation

## 2011-09-14 DIAGNOSIS — Z79899 Other long term (current) drug therapy: Secondary | ICD-10-CM | POA: Insufficient documentation

## 2011-09-14 DIAGNOSIS — G4730 Sleep apnea, unspecified: Secondary | ICD-10-CM | POA: Insufficient documentation

## 2011-09-14 DIAGNOSIS — R109 Unspecified abdominal pain: Secondary | ICD-10-CM | POA: Insufficient documentation

## 2011-09-14 DIAGNOSIS — E7800 Pure hypercholesterolemia, unspecified: Secondary | ICD-10-CM | POA: Insufficient documentation

## 2011-09-14 DIAGNOSIS — K219 Gastro-esophageal reflux disease without esophagitis: Secondary | ICD-10-CM | POA: Insufficient documentation

## 2011-09-14 DIAGNOSIS — Z8679 Personal history of other diseases of the circulatory system: Secondary | ICD-10-CM | POA: Insufficient documentation

## 2011-09-14 LAB — COMPREHENSIVE METABOLIC PANEL
Alkaline Phosphatase: 103 U/L (ref 39–117)
BUN: 19 mg/dL (ref 6–23)
Chloride: 103 mEq/L (ref 96–112)
GFR calc Af Amer: 75 mL/min — ABNORMAL LOW (ref >90)
GFR calc non Af Amer: 65 mL/min — ABNORMAL LOW (ref >90)
Glucose, Bld: 115 mg/dL — ABNORMAL HIGH (ref 70–99)
Potassium: 3.5 mEq/L (ref 3.5–5.1)
Total Bilirubin: 0.2 mg/dL — ABNORMAL LOW (ref 0.3–1.2)

## 2011-09-14 LAB — CBC
HCT: 42.3 % (ref 36.0–46.0)
MCH: 27.6 pg (ref 26.0–34.0)
MCV: 82.1 fL (ref 78.0–100.0)
RDW: 15.4 % (ref 11.5–15.5)
WBC: 11.7 10*3/uL — ABNORMAL HIGH (ref 4.0–10.5)

## 2011-09-14 LAB — URINALYSIS, ROUTINE W REFLEX MICROSCOPIC
Bilirubin Urine: NEGATIVE
Ketones, ur: NEGATIVE mg/dL
Nitrite: NEGATIVE
Urobilinogen, UA: 1 mg/dL (ref 0.0–1.0)

## 2011-09-14 LAB — DIFFERENTIAL
Eosinophils Relative: 2 % (ref 0–5)
Lymphs Abs: 2.2 10*3/uL (ref 0.7–4.0)
Monocytes Relative: 7 % (ref 3–12)
Neutro Abs: 8.5 10*3/uL — ABNORMAL HIGH (ref 1.7–7.7)

## 2011-09-14 LAB — URINE MICROSCOPIC-ADD ON

## 2011-09-14 LAB — LIPASE, BLOOD: Lipase: 14 U/L (ref 11–59)

## 2011-09-14 MED ORDER — ONDANSETRON HCL 4 MG/2ML IJ SOLN
4.0000 mg | Freq: Once | INTRAMUSCULAR | Status: AC
  Administered 2011-09-14: 4 mg via INTRAVENOUS
  Filled 2011-09-14: qty 2

## 2011-09-14 MED ORDER — OXYCODONE-ACETAMINOPHEN 5-325 MG PO TABS: 1.0000 | ORAL_TABLET | ORAL | Status: AC | PRN

## 2011-09-14 MED ORDER — ONDANSETRON HCL 4 MG PO TABS: 4.0000 mg | ORAL_TABLET | Freq: Four times a day (QID) | ORAL | Status: AC

## 2011-09-14 MED ORDER — SODIUM CHLORIDE 0.9 % IV BOLUS (SEPSIS)
1000.0000 mL | Freq: Once | INTRAVENOUS | Status: AC
  Administered 2011-09-14: 1000 mL via INTRAVENOUS

## 2011-09-14 MED ORDER — HYDROMORPHONE HCL PF 1 MG/ML IJ SOLN
1.0000 mg | Freq: Once | INTRAMUSCULAR | Status: AC
  Administered 2011-09-14: 1 mg via INTRAVENOUS
  Filled 2011-09-14: qty 1

## 2011-09-14 NOTE — ED Notes (Signed)
Abdominal pain and diarrhea x 4 hours ago. Saw her MD today for sinus allergies and acid reflux. She was started on a new antibiotic. Her pain started after taking Azithromycin.

## 2011-09-14 NOTE — ED Provider Notes (Signed)
History   This chart was scribed for Addalynne Golding A. Patrica Duel, MD by Melba Coon. The patient was seen in room MH07/MH07 and the patient's care was started at 10:00PM.    CSN: [id number]  Arrival date & time 09/14/11  2128   First MD Initiated Contact with Patient 09/14/11 2150      Chief Complaint  Patient presents with  . Abdominal Pain    (Consider location/radiation/quality/duration/timing/severity/associated sxs/prior treatment) HPI Tiffany Larsen is a 51 y.o. female who presents to the Emergency Department complaining of constant, non-radiating, moderate to severe epigastric abdominal pain with an onset this evening around 6:00PM with associated nausea and diarrhea. Pt was at her PCP earlier today for nasal congestion and dry cough that has been going on for a week. Pt was given Zithromax which she took around 5:00PM. Pt states that an hr later, she started to have diarrhea and nausea which started her abd pain. Pt thought that before she took the Zithromax, her acid reflux was aggravated, which her PCP told her to take Prilosec, and she believe that the Zithromax aggravated it even further. Pt's grandchildren had stomach bugs last week. No recent abx taken, no fever, no vomit, no abd surgeries, but has had a colonoscopy.  Past Medical History  Diagnosis Date  . Thyroid disease   . Hypertension   . Stroke   . Back pain   . Hypercholesteremia   . Sleep apnea   . Arthritis   . GERD (gastroesophageal reflux disease)     Past Surgical History  Procedure Date  . Nasal sinus surgery   . Abdominal hysterectomy   . Carpal tunnel release   . Nasal sinus surgery     No family history on file.  History  Substance Use Topics  . Smoking status: Never Smoker   . Smokeless tobacco: Not on file  . Alcohol Use: No    OB History    Grav Para Term Preterm Abortions TAB SAB Ect Mult Living                  Review of Systems 10 Systems reviewed and are negative for acute change except  as noted in the HPI.  Allergies  Review of patient's allergies indicates no known allergies.  Home Medications   Current Outpatient Rx  Name Route Sig Dispense Refill  . ALBUTEROL 90 MCG/ACT IN AERS Inhalation Inhale 2 puffs into the lungs every 6 (six) hours as needed. For shortness of breath    . ALLOPURINOL 300 MG PO TABS Oral Take 300 mg by mouth daily.      . ATENOLOL-CHLORTHALIDONE 50-25 MG PO TABS Oral Take 1 tablet by mouth daily.      Marland Kitchen BUTALBITAL-APAP-CAFFEINE 50-325-40 MG PO TABS Oral Take 1 tablet by mouth every 4 (four) hours as needed. For headache     . ESOMEPRAZOLE MAGNESIUM 40 MG PO CPDR Oral Take 40 mg by mouth daily before breakfast.      . LEVOTHYROXINE SODIUM 88 MCG PO TABS Oral Take 88 mcg by mouth daily.      Marland Kitchen LISINOPRIL 10 MG PO TABS Oral Take 10 mg by mouth daily.      Marland Kitchen NORTRIPTYLINE HCL 25 MG PO CAPS Oral Take 25 mg by mouth at bedtime.      Marland Kitchen POTASSIUM CHLORIDE CRYS ER 20 MEQ PO TBCR Oral Take 20 mEq by mouth daily.      Marland Kitchen PRAVASTATIN SODIUM 40 MG PO TABS Oral Take 40 mg by  mouth daily.        BP 120/74  Pulse 77  Temp(Src) 98.2 F (36.8 C) (Oral)  Resp 22  SpO2 100%  Physical Exam  Nursing note and vitals reviewed. Constitutional: She appears well-developed and well-nourished.       Awake, alert, nontoxic appearance.  HENT:  Head: Normocephalic and atraumatic.  Eyes: Conjunctivae and EOM are normal. Pupils are equal, round, and reactive to light. Right eye exhibits no discharge. Left eye exhibits no discharge.  Neck: Normal range of motion. Neck supple.  Cardiovascular: Normal rate, regular rhythm and normal heart sounds.   Pulmonary/Chest: Effort normal and breath sounds normal. She has no wheezes. She exhibits no tenderness.  Abdominal: Soft. Bowel sounds are normal. She exhibits no distension and no mass. There is tenderness (TTP in epigastric abd only. No RUQ or RLQ tenderness.). There is no rebound and no guarding.  Musculoskeletal: She  exhibits no edema and no tenderness.       Baseline ROM, no obvious new focal weakness.  Neurological:       Mental status and motor strength appears baseline for patient and situation.  Skin: Skin is warm. No rash noted.  Psychiatric: She has a normal mood and affect. Her behavior is normal.    ED Course  Procedures (including critical care time)  DIAGNOSTIC STUDIES: Oxygen Saturation is 100% on room air, normal by my interpretation.    COORDINATION OF CARE:  10:04PM - EDMD will order IV fluids and pain meds   Labs Reviewed - No data to display No results found.   No diagnosis found.    MDM  Pt is seen and examined;  Initial history and physical completed.  Will follow.     I personally performed the services described in this documentation, which was scribed in my presence.  The recorded information has been reviewed and considered. Frank Pilger A. Patrica Duel, MD.  @ATTPRO @     Results for orders placed during the hospital encounter of 09/14/11  CBC      Component Value Range   WBC 11.7 (*) 4.0 - 10.5 (K/uL)   RBC 5.15 (*) 3.87 - 5.11 (MIL/uL)   Hemoglobin 14.2  12.0 - 15.0 (g/dL)   HCT 16.1  09.6 - 04.5 (%)   MCV 82.1  78.0 - 100.0 (fL)   MCH 27.6  26.0 - 34.0 (pg)   MCHC 33.6  30.0 - 36.0 (g/dL)   RDW 40.9  81.1 - 91.4 (%)   Platelets 257  150 - 400 (K/uL)  DIFFERENTIAL      Component Value Range   Neutrophils Relative 72  43 - 77 (%)   Lymphocytes Relative 19  12 - 46 (%)   Monocytes Relative 7  3 - 12 (%)   Eosinophils Relative 2  0 - 5 (%)   Basophils Relative 0  0 - 1 (%)   Neutro Abs 8.5 (*) 1.7 - 7.7 (K/uL)   Lymphs Abs 2.2  0.7 - 4.0 (K/uL)   Monocytes Absolute 0.8  0.1 - 1.0 (K/uL)   Eosinophils Absolute 0.2  0.0 - 0.7 (K/uL)   Basophils Absolute 0.0  0.0 - 0.1 (K/uL)   WBC Morphology ATYPICAL LYMPHOCYTES     Smear Review LARGE PLATELETS PRESENT    COMPREHENSIVE METABOLIC PANEL      Component Value Range   Sodium 141  135 - 145 (mEq/L)   Potassium 3.5   3.5 - 5.1 (mEq/L)   Chloride 103  96 - 112 (mEq/L)  CO2 26  19 - 32 (mEq/L)   Glucose, Bld 115 (*) 70 - 99 (mg/dL)   BUN 19  6 - 23 (mg/dL)   Creatinine, Ser 1.61  0.50 - 1.10 (mg/dL)   Calcium 09.6  8.4 - 10.5 (mg/dL)   Total Protein 8.3  6.0 - 8.3 (g/dL)   Albumin 4.2  3.5 - 5.2 (g/dL)   AST 33  0 - 37 (U/L)   ALT 75 (*) 0 - 35 (U/L)   Alkaline Phosphatase 103  39 - 117 (U/L)   Total Bilirubin 0.2 (*) 0.3 - 1.2 (mg/dL)   GFR calc non Af Amer 65 (*) >90 (mL/min)   GFR calc Af Amer 75 (*) >90 (mL/min)  LIPASE, BLOOD      Component Value Range   Lipase 14  11 - 59 (U/L)  URINALYSIS, ROUTINE W REFLEX MICROSCOPIC      Component Value Range   Color, Urine YELLOW  YELLOW    APPearance CLEAR  CLEAR    Specific Gravity, Urine 1.019  1.005 - 1.030    pH 5.5  5.0 - 8.0    Glucose, UA NEGATIVE  NEGATIVE (mg/dL)   Hgb urine dipstick TRACE (*) NEGATIVE    Bilirubin Urine NEGATIVE  NEGATIVE    Ketones, ur NEGATIVE  NEGATIVE (mg/dL)   Protein, ur NEGATIVE  NEGATIVE (mg/dL)   Urobilinogen, UA 1.0  0.0 - 1.0 (mg/dL)   Nitrite NEGATIVE  NEGATIVE    Leukocytes, UA NEGATIVE  NEGATIVE   PREGNANCY, URINE      Component Value Range   Preg Test, Ur NEGATIVE  NEGATIVE   URINE MICROSCOPIC-ADD ON      Component Value Range   Squamous Epithelial / LPF RARE  RARE    WBC, UA 0-2  <3 (WBC/hpf)   RBC / HPF 0-2  <3 (RBC/hpf)   Bacteria, UA RARE  RARE    No results found.  Patient is reassessed in the room. She is feeling much better. Lab studies showed a white blood cell count of 11.7, electrolytes, and kidney function were normal. Slight elevation of ALT and bilirubin was low at 0.2. Lipase was normal urinalysis, negative for ketones, or any gross signs of infection   This will be stable for discharge at this time. She was given prescriptions for Percocet and Zofran for symptomatic treatment. Patient was told to advance diet very slowly and to go with clear liquids tomorrow and yogurt and  essentially a brat diet. She was told that she should be feeling better within 12-24 hours and, of course, to return to the ER for any concerns or changing symptoms   Discharged home in stable and improved condition  Treva Huyett A. Patrica Duel, MD 09/14/11 [phone]

## 2011-09-14 NOTE — Discharge Instructions (Signed)
Abdominal Pain (Nonspecific) Your exam might not show the exact reason you have abdominal pain. Since there are many different causes of abdominal pain, another checkup and more tests may be needed. It is very important to follow up for lasting (persistent) or worsening symptoms. A possible cause of abdominal pain in any person who still has his or her appendix is acute appendicitis. Appendicitis is often hard to diagnose. Normal blood tests, urine tests, ultrasound, and CT scans do not completely rule out early appendicitis or other causes of abdominal pain. Sometimes, only the changes that happen over time will allow appendicitis and other causes of abdominal pain to be determined. Other potential problems that may require surgery may also take time to become more apparent. Because of this, it is important that you follow all of the instructions below. HOME CARE INSTRUCTIONS   Rest as much as possible.   Do not eat solid food until your pain is gone.   While adults or children have pain: A diet of water, weak decaffeinated tea, broth or bouillon, gelatin, oral rehydration solutions (ORS), frozen ice pops, or ice chips may be helpful.   When pain is gone in adults or children: Start a light diet (dry toast, crackers, applesauce, or white rice). Increase the diet slowly as long as it does not bother you. Eat no dairy products (including cheese and eggs) and no spicy, fatty, fried, or high-fiber foods.   Use no alcohol, caffeine, or cigarettes.   Take your regular medicines unless your caregiver told you not to.   Take any prescribed medicine as directed.   Only take over-the-counter or prescription medicines for pain, discomfort, or fever as directed by your caregiver. Do not give aspirin to children.  If your caregiver has given you a follow-up appointment, it is very important to keep that appointment. Not keeping the appointment could result in a permanent injury and/or lasting (chronic) pain  and/or disability. If there is any problem keeping the appointment, you must call to reschedule.  SEEK IMMEDIATE MEDICAL CARE IF:   Your pain is not gone in 24 hours.   Your pain becomes worse, changes location, or feels different.   You or your child has an oral temperature above 102 F (38.9 C), not controlled by medicine.   Your baby is older than 3 months with a rectal temperature of 102 F (38.9 C) or higher.   Your baby is 54 months old or younger with a rectal temperature of 100.4 F (38 C) or higher.   You have shaking chills.   You keep throwing up (vomiting) or cannot drink liquids.   There is blood in your vomit or you see blood in your bowel movements.   Your bowel movements become dark or black.   You have frequent bowel movements.   Your bowel movements stop (become blocked) or you cannot pass gas.   You have bloody, frequent, or painful urination.   You have yellow discoloration in the skin or whites of the eyes.   Your stomach becomes bloated or bigger.   You have dizziness or fainting.   You have chest or back pain.  MAKE SURE YOU:   Understand these instructions.   Will watch your condition.   Will get help right away if you are not doing well or get worse.  Document Released: 07/13/2005 Document Revised: 03/25/2011 Document Reviewed: 06/10/2009 [facility] Patient Information 2012 [place], [place].    Diarrhea Infections caused by germs (bacterial) or a virus commonly  cause diarrhea. Your caregiver has determined that with time, rest and fluids, the diarrhea should improve. In general, eat normally while drinking more water than usual. Although water may prevent dehydration, it does not contain salt and minerals (electrolytes). Broths, weak tea without caffeine and oral rehydration solutions (ORS) replace fluids and electrolytes. Small amounts of fluids should be taken frequently. Large amounts at one time may not be tolerated. Plain water may be  harmful in infants and the elderly. Oral rehydrating solutions (ORS) are available at pharmacies and grocery stores. ORS replace water and important electrolytes in proper proportions. Sports drinks are not as effective as ORS and may be harmful due to sugars worsening diarrhea.  ORS is especially recommended for use in children with diarrhea. As a general guideline for children, replace any new fluid losses from diarrhea and/or vomiting with ORS as follows:   If your child weighs 22 pounds or under (10 kg or less), give 60-120 mL ( -  cup or 2 - 4 ounces) of ORS for each episode of diarrheal stool or vomiting episode.   If your child weighs more than 22 pounds (more than 10 kgs), give 120-240 mL ( - 1 cup or 4 - 8 ounces) of ORS for each diarrheal stool or episode of vomiting.   While correcting for dehydration, children should eat normally. However, foods high in sugar should be avoided because this may worsen diarrhea. Large amounts of carbonated soft drinks, juice, gelatin desserts and other highly sugared drinks should be avoided.   After correction of dehydration, other liquids that are appealing to the child may be added. Children should drink small amounts of fluids frequently and fluids should be increased as tolerated. Children should drink enough fluids to keep urine clear or pale yellow.   Adults should eat normally while drinking more fluids than usual. Drink small amounts of fluids frequently and increase as tolerated. Drink enough fluids to keep urine clear or pale yellow. Broths, weak decaffeinated tea, lemon lime soft drinks (allowed to go flat) and ORS replace fluids and electrolytes.   Avoid:   Carbonated drinks.   Juice.   Extremely hot or cold fluids.   Caffeine drinks.   Fatty, greasy foods.   Alcohol.   Tobacco.   Too much intake of anything at one time.   Gelatin desserts.   Probiotics are active cultures of beneficial bacteria. They may lessen the amount  and number of diarrheal stools in adults. Probiotics can be found in yogurt with active cultures and in supplements.   Wash hands well to avoid spreading bacteria and virus.   Anti-diarrheal medications are not recommended for infants and children.   Only take over-the-counter or prescription medicines for pain, discomfort or fever as directed by your caregiver. Do not give aspirin to children because it may cause Reye's Syndrome.   For adults, ask your caregiver if you should continue all prescribed and over-the-counter medicines.   If your caregiver has given you a follow-up appointment, it is very important to keep that appointment. Not keeping the appointment could result in a chronic or permanent injury, and disability. If there is any problem keeping the appointment, you must call back to this facility for assistance.  SEEK IMMEDIATE MEDICAL CARE IF:   You or your child is unable to keep fluids down or other symptoms or problems become worse in spite of treatment.   Vomiting or diarrhea develops and becomes persistent.   There is vomiting of blood or bile (  green material).   There is blood in the stool or the stools are black and tarry.   There is no urine output in 6-8 hours or there is only a small amount of very dark urine.   Abdominal pain develops, increases or localizes.   You have a fever.   Your baby is older than 3 months with a rectal temperature of 102 F (38.9 C) or higher.   Your baby is 93 months old or younger with a rectal temperature of 100.4 F (38 C) or higher.   You or your child develops excessive weakness, dizziness, fainting or extreme thirst.   You or your child develops a rash, stiff neck, severe headache or become irritable or sleepy and difficult to awaken.  MAKE SURE YOU:   Understand these instructions.   Will watch your condition.   Will get help right away if you are not doing well or get worse.  Document Released: 07/03/2002 Document  Revised: 03/25/2011 Document Reviewed: 05/20/2009 [facility] Patient Information 2012 [place], [place].

## 2012-01-19 ENCOUNTER — Emergency Department (HOSPITAL_BASED_OUTPATIENT_CLINIC_OR_DEPARTMENT_OTHER)
Admission: EM | Admit: 2012-01-19 | Discharge: 2012-01-19 | Disposition: A | Discharge status: Home / Self Care | Payer: Medicare Other | Attending: Emergency Medicine | Admitting: Emergency Medicine

## 2012-01-19 ENCOUNTER — Encounter (HOSPITAL_BASED_OUTPATIENT_CLINIC_OR_DEPARTMENT_OTHER): Payer: Self-pay

## 2012-01-19 ENCOUNTER — Emergency Department (HOSPITAL_BASED_OUTPATIENT_CLINIC_OR_DEPARTMENT_OTHER): Payer: Medicare Other

## 2012-01-19 DIAGNOSIS — R29898 Other symptoms and signs involving the musculoskeletal system: Secondary | ICD-10-CM | POA: Insufficient documentation

## 2012-01-19 DIAGNOSIS — R209 Unspecified disturbances of skin sensation: Secondary | ICD-10-CM | POA: Insufficient documentation

## 2012-01-19 DIAGNOSIS — E079 Disorder of thyroid, unspecified: Secondary | ICD-10-CM | POA: Insufficient documentation

## 2012-01-19 DIAGNOSIS — I10 Essential (primary) hypertension: Secondary | ICD-10-CM | POA: Insufficient documentation

## 2012-01-19 DIAGNOSIS — J329 Chronic sinusitis, unspecified: Secondary | ICD-10-CM | POA: Insufficient documentation

## 2012-01-19 DIAGNOSIS — E7800 Pure hypercholesterolemia, unspecified: Secondary | ICD-10-CM | POA: Insufficient documentation

## 2012-01-19 DIAGNOSIS — Z79899 Other long term (current) drug therapy: Secondary | ICD-10-CM | POA: Insufficient documentation

## 2012-01-19 DIAGNOSIS — Z8673 Personal history of transient ischemic attack (TIA), and cerebral infarction without residual deficits: Secondary | ICD-10-CM | POA: Insufficient documentation

## 2012-01-19 DIAGNOSIS — K219 Gastro-esophageal reflux disease without esophagitis: Secondary | ICD-10-CM | POA: Insufficient documentation

## 2012-01-19 DIAGNOSIS — R202 Paresthesia of skin: Secondary | ICD-10-CM

## 2012-01-19 HISTORY — DX: Cerebral infarction, unspecified: I63.9

## 2012-01-19 LAB — DIFFERENTIAL
Eosinophils Absolute: 0.2 10*3/uL (ref 0.0–0.7)
Lymphocytes Relative: 19 % (ref 12–46)
Lymphs Abs: 1.6 10*3/uL (ref 0.7–4.0)
Monocytes Relative: 8 % (ref 3–12)
Neutro Abs: 6 10*3/uL (ref 1.7–7.7)
Neutrophils Relative %: 70 % (ref 43–77)

## 2012-01-19 LAB — CBC
Hemoglobin: 13.7 g/dL (ref 12.0–15.0)
Platelets: 263 10*3/uL (ref 150–400)
RBC: 4.94 MIL/uL (ref 3.87–5.11)
WBC: 8.5 10*3/uL (ref 4.0–10.5)

## 2012-01-19 LAB — BASIC METABOLIC PANEL
CO2: 29 mEq/L (ref 19–32)
Chloride: 102 mEq/L (ref 96–112)
GFR calc non Af Amer: 58 mL/min — ABNORMAL LOW (ref >90)
Glucose, Bld: 98 mg/dL (ref 70–99)
Potassium: 3.3 mEq/L — ABNORMAL LOW (ref 3.5–5.1)
Sodium: 142 mEq/L (ref 135–145)

## 2012-01-19 MED ORDER — AMOXICILLIN 500 MG PO CAPS: 500.0000 mg | ORAL_CAPSULE | Freq: Three times a day (TID) | ORAL | Status: AC

## 2012-01-19 NOTE — Discharge Instructions (Signed)

## 2012-01-19 NOTE — ED Provider Notes (Signed)
History     CSN: [id number]  Arrival date & time 01/19/12  1647   First MD Initiated Contact with Patient 01/19/12 1722      Chief Complaint  Patient presents with  . Tingling    (Consider location/radiation/quality/duration/timing/severity/associated sxs/prior treatment) Patient is a 51 y.o. female presenting with extremity weakness. The history is provided by the patient. No language interpreter was used.  Extremity Weakness This is a new problem. The current episode started yesterday. The problem occurs constantly. The problem has been unchanged. Nothing aggravates the symptoms. She has tried nothing for the symptoms.  Pt complains of tingling to her left arm that started yesterday.   Pt reports tingling in her arm and her leg.  Pt worried about a stroke however pt reports she has felt similar when her thyroid was low.  Pt reports she she feels tingly, hypersensitive,  No weakness,  No numbness  Past Medical History  Diagnosis Date  . Thyroid disease   . Hypertension   . Stroke   . Back pain   . Hypercholesteremia   . Sleep apnea   . Arthritis   . GERD (gastroesophageal reflux disease)   . CVA (cerebral infarction)     Past Surgical History  Procedure Date  . Nasal sinus surgery   . Abdominal hysterectomy   . Carpal tunnel release   . Nasal sinus surgery     No family history on file.  History  Substance Use Topics  . Smoking status: Never Smoker   . Smokeless tobacco: Not on file  . Alcohol Use: No    OB History    Grav Para Term Preterm Abortions TAB SAB Ect Mult Living                  Review of Systems  Musculoskeletal: Positive for extremity weakness.  All other systems reviewed and are negative.    Allergies  Review of patient's allergies indicates no known allergies.  Home Medications   Current Outpatient Rx  Name Route Sig Dispense Refill  . ALBUTEROL 90 MCG/ACT IN AERS Inhalation Inhale 2 puffs into the lungs every 6 (six) hours as  needed. For shortness of breath    . ALLOPURINOL 300 MG PO TABS Oral Take 300 mg by mouth daily.      . ATENOLOL-CHLORTHALIDONE 50-25 MG PO TABS Oral Take 1 tablet by mouth daily.      Marland Kitchen BUTALBITAL-APAP-CAFFEINE 50-325-40 MG PO TABS Oral Take 1 tablet by mouth every 4 (four) hours as needed. For headache     . ESOMEPRAZOLE MAGNESIUM 40 MG PO CPDR Oral Take 40 mg by mouth daily before breakfast.      . LEVOTHYROXINE SODIUM 88 MCG PO TABS Oral Take 88 mcg by mouth daily.      Marland Kitchen LISINOPRIL 10 MG PO TABS Oral Take 10 mg by mouth daily.      Marland Kitchen POTASSIUM CHLORIDE CRYS ER 20 MEQ PO TBCR Oral Take 20 mEq by mouth daily.      Marland Kitchen PRAVASTATIN SODIUM 40 MG PO TABS Oral Take 40 mg by mouth daily.        BP 137/77  Pulse 60  Temp 98.3 F (36.8 C) (Oral)  Resp 16  Ht 5\' 2"  (1.575 m)  Wt 177 lb (80.287 kg)  BMI 32.37 kg/m2  SpO2 98%  Physical Exam  Nursing note and vitals reviewed. Constitutional: She is oriented to person, place, and time. She appears well-developed and well-nourished.  HENT:  Head:  Normocephalic and atraumatic.  Right Ear: External ear normal.  Left Ear: External ear normal.  Nose: Nose normal.  Mouth/Throat: Oropharynx is clear and moist.  Eyes: Conjunctivae and EOM are normal. Pupils are equal, round, and reactive to light.  Neck: Normal range of motion. Neck supple.  Cardiovascular: Normal rate and normal heart sounds.   Pulmonary/Chest: Effort normal.  Abdominal: Soft.  Musculoskeletal: Normal range of motion.  Neurological: She is alert and oriented to person, place, and time. She has normal reflexes.  Skin: Skin is warm.  Psychiatric: She has a normal mood and affect.    ED Course  Procedures (including critical care time)  Labs Reviewed - No data to display No results found.   No diagnosis found. Results for orders placed during the hospital encounter of 01/19/12  CBC      Component Value Range   WBC 8.5  4.0 - 10.5 K/uL   RBC 4.94  3.87 - 5.11 MIL/uL    Hemoglobin 13.7  12.0 - 15.0 g/dL   HCT 16.1  09.6 - 04.5 %   MCV 83.4  78.0 - 100.0 fL   MCH 27.7  26.0 - 34.0 pg   MCHC 33.3  30.0 - 36.0 g/dL   RDW 40.9  81.1 - 91.4 %   Platelets 263  150 - 400 K/uL  DIFFERENTIAL      Component Value Range   Neutrophils Relative 70  43 - 77 %   Neutro Abs 6.0  1.7 - 7.7 K/uL   Lymphocytes Relative 19  12 - 46 %   Lymphs Abs 1.6  0.7 - 4.0 K/uL   Monocytes Relative 8  3 - 12 %   Monocytes Absolute 0.7  0.1 - 1.0 K/uL   Eosinophils Relative 3  0 - 5 %   Eosinophils Absolute 0.2  0.0 - 0.7 K/uL   Basophils Relative 0  0 - 1 %   Basophils Absolute 0.0  0.0 - 0.1 K/uL  BASIC METABOLIC PANEL      Component Value Range   Sodium 142  135 - 145 mEq/L   Potassium 3.3 (*) 3.5 - 5.1 mEq/L   Chloride 102  96 - 112 mEq/L   CO2 29  19 - 32 mEq/L   Glucose, Bld 98  70 - 99 mg/dL   BUN 13  6 - 23 mg/dL   Creatinine, Ser 7.82  0.50 - 1.10 mg/dL   Calcium 9.8  8.4 - 95.6 mg/dL   GFR calc non Af Amer 58 (*) >90 mL/min   GFR calc Af Amer 67 (*) >90 mL/min   Ct Head Wo Contrast  01/19/2012  *RADIOLOGY REPORT*  Clinical Data: 51 year old female with abnormal sensation on the left side of the body.  CT HEAD WITHOUT CONTRAST  Technique:  Contiguous axial images were obtained from the base of the skull through the vertex without contrast.  Comparison: Skull series 03/07/2011.  Findings: Minimal paranasal sinus mucosal thickening; previous maxillary antrostomies and ethmoidectomies.  The left frontal sinus is hypoplastic.  The mastoids are clear.  No acute osseous abnormality identified.  Visualized orbits and scalp soft tissues are within normal limits.  Dystrophic basal ganglia calcifications.  Normal cerebral volume.  No ventriculomegaly. No midline shift, mass effect, or evidence of mass lesion.  No acute intracranial hemorrhage identified.  No evidence of cortically based acute infarction identified.  Wallace Cullens- white matter differentiation is within normal limits  throughout the brain.  Occasional dural calcifications. No suspicious intracranial  vascular hyperdensity.  IMPRESSION:  1. Normal noncontrast CT appearance of the brain. 2.  Previous paranasal sinus surgery with minimal mucosal thickening.  Original Report Authenticated By: Harley Hallmark, M.D.  Dr. Anitra Lauth in to see,  We doubt CVA.   Pt advised to follow up with Dr. Julio Sicks.   I will treat sinus symptoms    MDM   Date: 01/19/2012  Rate: 59  Rhythm: sinus bradycardia  QRS Axis: normal  Intervals: normal  ST/T Wave abnormalities: normal  Conduction Disutrbances:none  Narrative Interpretation:   Old EKG Reviewed: none available         Elson Areas, [place] 01/19/12 2006

## 2012-01-19 NOTE — ED Notes (Signed)
Pt c/o tingling to L arm onset last night.  Pt states it feels like nerve pain.  Pt states she has HX of CVA so is "nervous" about it.  Pt states she called PCP but was unable to see due to car issues.

## 2012-01-20 NOTE — ED Provider Notes (Signed)
Medical screening examination/treatment/procedure(s) were conducted as a shared visit with non-physician practitioner(s) and myself.  I personally evaluated the patient during the encounter With persistent hypersensitivity to the left upper and lower extremity that's been ongoing for the last 24 hours. There is no new weakness and no facial involvement. Patient is able to ambulate normally and has no slurred speech.  Prior stroke had no source (normal echo, carotid dopplers)  Thought that it was due to medication.  Pt has normal head CT and would expect to see something with 24 hour old infarct.  Discussed findings with pt and will have f/u with PCP tomorrow for further care.  Gwyneth Sprout, MD 01/20/12 1722

## 2012-03-29 ENCOUNTER — Emergency Department (HOSPITAL_BASED_OUTPATIENT_CLINIC_OR_DEPARTMENT_OTHER)
Admission: EM | Admit: 2012-03-29 | Discharge: 2012-03-29 | Disposition: A | Discharge status: Home / Self Care | Payer: Medicare Other | Attending: Emergency Medicine | Admitting: Emergency Medicine

## 2012-03-29 ENCOUNTER — Encounter (HOSPITAL_BASED_OUTPATIENT_CLINIC_OR_DEPARTMENT_OTHER): Payer: Self-pay | Admitting: *Deleted

## 2012-03-29 DIAGNOSIS — M129 Arthropathy, unspecified: Secondary | ICD-10-CM | POA: Insufficient documentation

## 2012-03-29 DIAGNOSIS — I10 Essential (primary) hypertension: Secondary | ICD-10-CM | POA: Insufficient documentation

## 2012-03-29 DIAGNOSIS — K219 Gastro-esophageal reflux disease without esophagitis: Secondary | ICD-10-CM | POA: Insufficient documentation

## 2012-03-29 DIAGNOSIS — E7800 Pure hypercholesterolemia, unspecified: Secondary | ICD-10-CM | POA: Insufficient documentation

## 2012-03-29 DIAGNOSIS — M25569 Pain in unspecified knee: Secondary | ICD-10-CM

## 2012-03-29 DIAGNOSIS — G4730 Sleep apnea, unspecified: Secondary | ICD-10-CM | POA: Insufficient documentation

## 2012-03-29 DIAGNOSIS — E079 Disorder of thyroid, unspecified: Secondary | ICD-10-CM | POA: Insufficient documentation

## 2012-03-29 DIAGNOSIS — Z8673 Personal history of transient ischemic attack (TIA), and cerebral infarction without residual deficits: Secondary | ICD-10-CM | POA: Insufficient documentation

## 2012-03-29 MED ORDER — OXYCODONE-ACETAMINOPHEN 5-325 MG PO TABS
1.0000 | ORAL_TABLET | Freq: Once | ORAL | Status: AC
  Administered 2012-03-29: 1 via ORAL
  Filled 2012-03-29 (×2): qty 1

## 2012-03-29 MED ORDER — IBUPROFEN 800 MG PO TABS
800.0000 mg | ORAL_TABLET | Freq: Once | ORAL | Status: AC
  Administered 2012-03-29: 800 mg via ORAL
  Filled 2012-03-29: qty 1

## 2012-03-29 MED ORDER — OXYCODONE-ACETAMINOPHEN 5-325 MG PO TABS: 1.0000 | ORAL_TABLET | Freq: Once | ORAL | Status: AC

## 2012-03-29 MED ORDER — IBUPROFEN 800 MG PO TABS: 800.0000 mg | ORAL_TABLET | Freq: Three times a day (TID) | ORAL | Status: AC

## 2012-03-29 NOTE — ED Notes (Signed)
Pt c/o of right knee pain.  States "I called my Doctor but they were unable to see me until tomorrow."  Pt says she injured knee a year ago and has been seeing an orthopedist for it.  Pt states she stepped out and the knee suddenly began to have pain.  States pain is a 10 on a scale of 0-10.

## 2012-03-29 NOTE — ED Notes (Signed)
Pain in her right knee. States when she was moving earlier today she felt a pain in her knee that has not gotten better. Chronic knee pain. Last had a joint feeler into the knee about a month ago.

## 2012-04-14 NOTE — ED Provider Notes (Signed)
History     CSN: [id number]  Arrival date & time 03/29/12  1642   First MD Initiated Contact with Patient 03/29/12 1709      Chief Complaint  Patient presents with  . Knee Pain    (Consider location/radiation/quality/duration/timing/severity/associated sxs/prior treatment) Patient is a 51 y.o. female presenting with knee pain. The history is provided by the patient.  Knee Pain This is a new problem. The current episode started yesterday. The problem occurs constantly. The problem has been gradually worsening. Pertinent negatives include no chills or fever. Associated symptoms comments: Knee pain after moving furniture. She has chronic knee discomfort. No significant swelling. No direct trauma. No calf pain. Her discomfort is limited to anterior and lateral knee joint..    Past Medical History  Diagnosis Date  . Thyroid disease   . Hypertension   . Stroke   . Back pain   . Hypercholesteremia   . Sleep apnea   . Arthritis   . GERD (gastroesophageal reflux disease)   . CVA (cerebral infarction)     Past Surgical History  Procedure Date  . Nasal sinus surgery   . Abdominal hysterectomy   . Carpal tunnel release   . Nasal sinus surgery     No family history on file.  History  Substance Use Topics  . Smoking status: Never Smoker   . Smokeless tobacco: Not on file  . Alcohol Use: No    OB History    Grav Para Term Preterm Abortions TAB SAB Ect Mult Living                  Review of Systems  Constitutional: Negative for fever and chills.  Respiratory: Negative.   Cardiovascular: Negative.   Gastrointestinal: Negative.   Musculoskeletal:       See HPI.  Skin: Negative.   Neurological: Negative.     Allergies  Review of patient's allergies indicates no known allergies.  Home Medications   Current Outpatient Rx  Name Route Sig Dispense Refill  . ALBUTEROL 90 MCG/ACT IN AERS Inhalation Inhale 2 puffs into the lungs every 6 (six) hours as needed. For  shortness of breath    . ATENOLOL-CHLORTHALIDONE 50-25 MG PO TABS Oral Take 1 tablet by mouth daily.      Marland Kitchen BUTALBITAL-APAP-CAFFEINE 50-325-40 MG PO TABS Oral Take 1 tablet by mouth every 4 (four) hours as needed. For headache     . COLCHICINE 0.6 MG PO TABS Oral Take 0.6 mg by mouth daily.    Marland Kitchen DIPHENHYDRAMINE HCL 25 MG PO TABS Oral Take 50 mg by mouth every 6 (six) hours as needed. For allergies    . ESOMEPRAZOLE MAGNESIUM 40 MG PO CPDR Oral Take 40 mg by mouth daily before breakfast.      . LEVOTHYROXINE SODIUM 100 MCG PO TABS Oral Take 100 mcg by mouth daily.    Marland Kitchen LISINOPRIL 10 MG PO TABS Oral Take 10 mg by mouth daily.      Marland Kitchen MENTHOL (TOPICAL ANALGESIC) 4 % EX GEL Apply externally Apply 1 application topically 2 (two) times daily as needed. For knee pain    . POTASSIUM CHLORIDE CRYS ER 20 MEQ PO TBCR Oral Take 20 mEq by mouth daily.      Marland Kitchen PRAVASTATIN SODIUM 80 MG PO TABS Oral Take 80 mg by mouth daily.      BP 104/60  Pulse 59  Temp 98 F (36.7 C) (Oral)  Resp 20  SpO2 100%  Physical Exam  Constitutional: She is oriented to person, place, and time. She appears well-developed and well-nourished.  Neck: Normal range of motion.  Pulmonary/Chest: Effort normal.  Musculoskeletal: She exhibits no edema.       Right knee mildly tender anterolaterally. No significant swelling or palpable effusion. Joint stable. No redness or warmth.  Neurological: She is alert and oriented to person, place, and time.  Skin: Skin is warm and dry.    ED Course  Procedures (including critical care time)  Labs Reviewed - No data to display No results found.   1. Knee pain       MDM  Knee pain after moving in patient w/chronic symptoms. Uncomplicated musculoskeletal pain.        Rodena Medin, PA-C 04/14/12 1052

## 2012-04-18 NOTE — ED Provider Notes (Signed)
Medical screening examination/treatment/procedure(s) were performed by non-physician practitioner and as supervising physician I was immediately available for consultation/collaboration.  Jones Skene, M.D.     Jones Skene, MD 04/18/12 1610

## 2012-07-22 ENCOUNTER — Emergency Department (HOSPITAL_BASED_OUTPATIENT_CLINIC_OR_DEPARTMENT_OTHER)
Admission: EM | Admit: 2012-07-22 | Discharge: 2012-07-22 | Disposition: A | Discharge status: Home / Self Care | Payer: Medicare Other | Attending: Emergency Medicine | Admitting: Emergency Medicine

## 2012-07-22 ENCOUNTER — Emergency Department (HOSPITAL_BASED_OUTPATIENT_CLINIC_OR_DEPARTMENT_OTHER): Payer: Medicare Other

## 2012-07-22 ENCOUNTER — Encounter (HOSPITAL_BASED_OUTPATIENT_CLINIC_OR_DEPARTMENT_OTHER): Payer: Self-pay | Admitting: *Deleted

## 2012-07-22 DIAGNOSIS — R112 Nausea with vomiting, unspecified: Secondary | ICD-10-CM

## 2012-07-22 DIAGNOSIS — K219 Gastro-esophageal reflux disease without esophagitis: Secondary | ICD-10-CM | POA: Insufficient documentation

## 2012-07-22 DIAGNOSIS — G4730 Sleep apnea, unspecified: Secondary | ICD-10-CM | POA: Insufficient documentation

## 2012-07-22 DIAGNOSIS — R197 Diarrhea, unspecified: Secondary | ICD-10-CM | POA: Insufficient documentation

## 2012-07-22 DIAGNOSIS — Z8673 Personal history of transient ischemic attack (TIA), and cerebral infarction without residual deficits: Secondary | ICD-10-CM | POA: Insufficient documentation

## 2012-07-22 DIAGNOSIS — I10 Essential (primary) hypertension: Secondary | ICD-10-CM | POA: Insufficient documentation

## 2012-07-22 DIAGNOSIS — E7800 Pure hypercholesterolemia, unspecified: Secondary | ICD-10-CM | POA: Insufficient documentation

## 2012-07-22 DIAGNOSIS — Z79899 Other long term (current) drug therapy: Secondary | ICD-10-CM | POA: Insufficient documentation

## 2012-07-22 DIAGNOSIS — M129 Arthropathy, unspecified: Secondary | ICD-10-CM | POA: Insufficient documentation

## 2012-07-22 DIAGNOSIS — Z8739 Personal history of other diseases of the musculoskeletal system and connective tissue: Secondary | ICD-10-CM | POA: Insufficient documentation

## 2012-07-22 DIAGNOSIS — E079 Disorder of thyroid, unspecified: Secondary | ICD-10-CM | POA: Insufficient documentation

## 2012-07-22 LAB — COMPREHENSIVE METABOLIC PANEL
Albumin: 4.1 g/dL (ref 3.5–5.2)
BUN: 27 mg/dL — ABNORMAL HIGH (ref 6–23)
Creatinine, Ser: 1 mg/dL (ref 0.50–1.10)
Total Protein: 8 g/dL (ref 6.0–8.3)

## 2012-07-22 LAB — URINALYSIS, ROUTINE W REFLEX MICROSCOPIC
Glucose, UA: NEGATIVE mg/dL
Leukocytes, UA: NEGATIVE
Protein, ur: NEGATIVE mg/dL
Specific Gravity, Urine: 1.028 (ref 1.005–1.030)

## 2012-07-22 LAB — CBC WITH DIFFERENTIAL/PLATELET
Basophils Relative: 0 % (ref 0–1)
Eosinophils Absolute: 0.2 10*3/uL (ref 0.0–0.7)
HCT: 40.3 % (ref 36.0–46.0)
Hemoglobin: 13.5 g/dL (ref 12.0–15.0)
MCH: 27.8 pg (ref 26.0–34.0)
MCHC: 33.5 g/dL (ref 30.0–36.0)
Monocytes Absolute: 1.3 10*3/uL — ABNORMAL HIGH (ref 0.1–1.0)
Monocytes Relative: 9 % (ref 3–12)
Neutrophils Relative %: 80 % — ABNORMAL HIGH (ref 43–77)

## 2012-07-22 LAB — URINE MICROSCOPIC-ADD ON

## 2012-07-22 LAB — LIPASE, BLOOD: Lipase: 16 U/L (ref 11–59)

## 2012-07-22 MED ORDER — IOHEXOL 300 MG/ML  SOLN
50.0000 mL | Freq: Once | INTRAMUSCULAR | Status: AC | PRN
  Administered 2012-07-22: 50 mL via ORAL

## 2012-07-22 MED ORDER — IOHEXOL 300 MG/ML  SOLN
100.0000 mL | Freq: Once | INTRAMUSCULAR | Status: AC | PRN
  Administered 2012-07-22: 100 mL via INTRAVENOUS

## 2012-07-22 MED ORDER — FENTANYL CITRATE 0.05 MG/ML IJ SOLN
50.0000 ug | Freq: Once | INTRAMUSCULAR | Status: AC
  Administered 2012-07-22: 50 ug via INTRAVENOUS
  Filled 2012-07-22: qty 2

## 2012-07-22 MED ORDER — ONDANSETRON HCL 4 MG/2ML IJ SOLN
4.0000 mg | Freq: Once | INTRAMUSCULAR | Status: AC
  Administered 2012-07-22: 4 mg via INTRAVENOUS
  Filled 2012-07-22: qty 2

## 2012-07-22 MED ORDER — SODIUM CHLORIDE 0.9 % IV BOLUS (SEPSIS)
1000.0000 mL | Freq: Once | INTRAVENOUS | Status: AC
  Administered 2012-07-22: 1000 mL via INTRAVENOUS

## 2012-07-22 MED ORDER — GI COCKTAIL ~~LOC~~
30.0000 mL | Freq: Once | ORAL | Status: AC
  Administered 2012-07-22: 30 mL via ORAL
  Filled 2012-07-22: qty 30

## 2012-07-22 MED ORDER — TRAMADOL HCL 50 MG PO TABS: 50.0000 mg | ORAL_TABLET | Freq: Four times a day (QID) | ORAL | Status: DC | PRN

## 2012-07-22 NOTE — ED Notes (Signed)
Patient transported to CT 

## 2012-07-22 NOTE — ED Notes (Signed)
Pt with abd pain and vomiting x 1 day

## 2012-07-22 NOTE — ED Provider Notes (Signed)
History     CSN: [id number]  Arrival date & time 07/22/12  0022   First MD Initiated Contact with Patient 07/22/12 0249      Chief Complaint  Patient presents with  . Abdominal Pain  . Emesis    (Consider location/radiation/quality/duration/timing/severity/associated sxs/prior treatment) Patient is a 51 y.o. female presenting with abdominal pain and vomiting. The history is provided by the patient.  Abdominal Pain The primary symptoms of the illness include abdominal pain, nausea, vomiting and diarrhea. The current episode started 3 to 5 hours ago. The onset of the illness was sudden. The problem has not changed since onset. The abdominal pain began 3 to 5 hours ago. The pain came on suddenly. The abdominal pain has been unchanged since its onset. The abdominal pain is located in the LUQ. The abdominal pain does not radiate. The severity of the abdominal pain is 10/10. The abdominal pain is relieved by nothing. The abdominal pain is exacerbated by vomiting and bowel movements.  Nausea began yesterday. The nausea is associated with eating.  Symptoms associated with the illness do not include chills. Significant associated medical issues do not include PUD.  Emesis  This is a new problem. The current episode started 3 to 5 hours ago. Episode frequency: once. The problem has not changed since onset.The emesis has an appearance of stomach contents. There has been no fever. Associated symptoms include abdominal pain and diarrhea. Pertinent negatives include no chills.    Past Medical History  Diagnosis Date  . Thyroid disease   . Hypertension   . Stroke   . Back pain   . Hypercholesteremia   . Sleep apnea   . Arthritis   . GERD (gastroesophageal reflux disease)   . CVA (cerebral infarction)     Past Surgical History  Procedure Date  . Nasal sinus surgery   . Abdominal hysterectomy   . Carpal tunnel release   . Nasal sinus surgery     No family history on file.  History    Substance Use Topics  . Smoking status: Never Smoker   . Smokeless tobacco: Never Used  . Alcohol Use: No    OB History    Grav Para Term Preterm Abortions TAB SAB Ect Mult Living                  Review of Systems  Constitutional: Negative for chills.  Gastrointestinal: Positive for nausea, vomiting, abdominal pain and diarrhea.  All other systems reviewed and are negative.    Allergies  Review of patient's allergies indicates no known allergies.  Home Medications   Current Outpatient Rx  Name  Route  Sig  Dispense  Refill  . ALBUTEROL 90 MCG/ACT IN AERS   Inhalation   Inhale 2 puffs into the lungs every 6 (six) hours as needed. For shortness of breath         . ATENOLOL-CHLORTHALIDONE 50-25 MG PO TABS   Oral   Take 1 tablet by mouth daily.           Marland Kitchen BUTALBITAL-APAP-CAFFEINE 50-325-40 MG PO TABS   Oral   Take 1 tablet by mouth every 4 (four) hours as needed. For headache          . COLCHICINE 0.6 MG PO TABS   Oral   Take 0.6 mg by mouth daily.         Marland Kitchen DIPHENHYDRAMINE HCL 25 MG PO TABS   Oral   Take 50 mg by mouth every 6 (  six) hours as needed. For allergies         . ESOMEPRAZOLE MAGNESIUM 40 MG PO CPDR   Oral   Take 40 mg by mouth daily before breakfast.           . VICOPROFEN PO   Oral   Take by mouth every 8 (eight) hours as needed.         Marland Kitchen LEVOTHYROXINE SODIUM 100 MCG PO TABS   Oral   Take 100 mcg by mouth daily.         Marland Kitchen LISINOPRIL 10 MG PO TABS   Oral   Take 10 mg by mouth daily.           Marland Kitchen MENTHOL (TOPICAL ANALGESIC) 4 % EX GEL   Apply externally   Apply 1 application topically 2 (two) times daily as needed. For knee pain         . POTASSIUM CHLORIDE CRYS ER 20 MEQ PO TBCR   Oral   Take 20 mEq by mouth daily.           Marland Kitchen PRAVASTATIN SODIUM 80 MG PO TABS   Oral   Take 80 mg by mouth daily.           BP 140/64  Pulse 60  Temp 97.8 F (36.6 C) (Oral)  Resp 18  Ht 5\' 2"  (1.575 m)  Wt 180 lb (81.647  kg)  BMI 32.92 kg/m2  SpO2 100%  Physical Exam  Constitutional: She is oriented to person, place, and time. She appears well-developed and well-nourished. No distress.  HENT:  Head: Normocephalic and atraumatic.  Mouth/Throat: Oropharynx is clear and moist.  Eyes: Conjunctivae normal are normal. Pupils are equal, round, and reactive to light.  Neck: Normal range of motion. Neck supple.  Cardiovascular: Normal rate and regular rhythm.   Pulmonary/Chest: Effort normal and breath sounds normal.  Abdominal: Soft. Bowel sounds are normal. There is no tenderness. There is no rebound and no guarding.  Musculoskeletal: Normal range of motion.  Neurological: She is alert and oriented to person, place, and time.  Skin: Skin is warm and dry.  Psychiatric: She has a normal mood and affect.    ED Course  Procedures (including critical care time)  Labs Reviewed  URINALYSIS, ROUTINE W REFLEX MICROSCOPIC - Abnormal; Notable for the following:    Hgb urine dipstick SMALL (*)     All other components within normal limits  URINE MICROSCOPIC-ADD ON - Abnormal; Notable for the following:    Squamous Epithelial / LPF FEW (*)     Bacteria, UA FEW (*)     Casts HYALINE CASTS (*)     All other components within normal limits  CBC WITH DIFFERENTIAL  COMPREHENSIVE METABOLIC PANEL   No results found.   No diagnosis found.    MDM  Pain likely from cramping will treat follow up with your family doctor for ongoing care       Breea Loncar K Briahnna Harries-Rasch, MD 07/22/12 [phone]

## 2013-02-22 ENCOUNTER — Encounter (HOSPITAL_BASED_OUTPATIENT_CLINIC_OR_DEPARTMENT_OTHER): Payer: Self-pay | Admitting: *Deleted

## 2013-02-22 ENCOUNTER — Emergency Department (HOSPITAL_BASED_OUTPATIENT_CLINIC_OR_DEPARTMENT_OTHER)
Admission: EM | Admit: 2013-02-22 | Discharge: 2013-02-22 | Disposition: A | Discharge status: Home / Self Care | Payer: Medicare Other | Attending: Emergency Medicine | Admitting: Emergency Medicine

## 2013-02-22 DIAGNOSIS — J329 Chronic sinusitis, unspecified: Secondary | ICD-10-CM | POA: Insufficient documentation

## 2013-02-22 DIAGNOSIS — Z79899 Other long term (current) drug therapy: Secondary | ICD-10-CM | POA: Insufficient documentation

## 2013-02-22 DIAGNOSIS — R197 Diarrhea, unspecified: Secondary | ICD-10-CM | POA: Insufficient documentation

## 2013-02-22 DIAGNOSIS — K219 Gastro-esophageal reflux disease without esophagitis: Secondary | ICD-10-CM | POA: Insufficient documentation

## 2013-02-22 DIAGNOSIS — I10 Essential (primary) hypertension: Secondary | ICD-10-CM | POA: Insufficient documentation

## 2013-02-22 DIAGNOSIS — E079 Disorder of thyroid, unspecified: Secondary | ICD-10-CM | POA: Insufficient documentation

## 2013-02-22 DIAGNOSIS — G4730 Sleep apnea, unspecified: Secondary | ICD-10-CM | POA: Insufficient documentation

## 2013-02-22 DIAGNOSIS — Z8673 Personal history of transient ischemic attack (TIA), and cerebral infarction without residual deficits: Secondary | ICD-10-CM | POA: Insufficient documentation

## 2013-02-22 DIAGNOSIS — E7800 Pure hypercholesterolemia, unspecified: Secondary | ICD-10-CM | POA: Insufficient documentation

## 2013-02-22 DIAGNOSIS — Z8739 Personal history of other diseases of the musculoskeletal system and connective tissue: Secondary | ICD-10-CM | POA: Insufficient documentation

## 2013-02-22 DIAGNOSIS — J029 Acute pharyngitis, unspecified: Secondary | ICD-10-CM | POA: Insufficient documentation

## 2013-02-22 LAB — URINALYSIS, ROUTINE W REFLEX MICROSCOPIC
Ketones, ur: NEGATIVE mg/dL
Leukocytes, UA: NEGATIVE
Nitrite: NEGATIVE
Specific Gravity, Urine: 1.009 (ref 1.005–1.030)
pH: 6 (ref 5.0–8.0)

## 2013-02-22 LAB — COMPREHENSIVE METABOLIC PANEL
AST: 27 U/L (ref 0–37)
Albumin: 3.9 g/dL (ref 3.5–5.2)
Alkaline Phosphatase: 117 U/L (ref 39–117)
BUN: 15 mg/dL (ref 6–23)
Creatinine, Ser: 1 mg/dL (ref 0.50–1.10)
Potassium: 3.1 mEq/L — ABNORMAL LOW (ref 3.5–5.1)
Total Protein: 7.9 g/dL (ref 6.0–8.3)

## 2013-02-22 LAB — CBC
HCT: 40.3 % (ref 36.0–46.0)
MCHC: 34 g/dL (ref 30.0–36.0)
Platelets: 250 10*3/uL (ref 150–400)
RDW: 14.2 % (ref 11.5–15.5)

## 2013-02-22 LAB — URINE MICROSCOPIC-ADD ON

## 2013-02-22 MED ORDER — DIPHENOXYLATE-ATROPINE 2.5-0.025 MG PO TABS: 1.0000 | ORAL_TABLET | Freq: Four times a day (QID) | ORAL | Status: DC | PRN

## 2013-02-22 MED ORDER — ONDANSETRON 4 MG PO TBDP: 4.0000 mg | ORAL_TABLET | Freq: Three times a day (TID) | ORAL | Status: DC | PRN

## 2013-02-22 MED ORDER — POTASSIUM CHLORIDE CRYS ER 20 MEQ PO TBCR
40.0000 meq | EXTENDED_RELEASE_TABLET | Freq: Once | ORAL | Status: AC
  Administered 2013-02-22: 40 meq via ORAL
  Filled 2013-02-22: qty 2

## 2013-02-22 MED ORDER — DIPHENOXYLATE-ATROPINE 2.5-0.025 MG PO TABS
1.0000 | ORAL_TABLET | Freq: Once | ORAL | Status: AC
  Administered 2013-02-22: 1 via ORAL
  Filled 2013-02-22: qty 1

## 2013-02-22 MED ORDER — ONDANSETRON HCL 4 MG/2ML IJ SOLN
4.0000 mg | Freq: Once | INTRAMUSCULAR | Status: AC
  Administered 2013-02-22: 4 mg via INTRAVENOUS
  Filled 2013-02-22: qty 2

## 2013-02-22 MED ORDER — CIPROFLOXACIN HCL 500 MG PO TABS: 500.0000 mg | ORAL_TABLET | Freq: Two times a day (BID) | ORAL | Status: DC

## 2013-02-22 MED ORDER — SODIUM CHLORIDE 0.9 % IV BOLUS (SEPSIS)
1000.0000 mL | Freq: Once | INTRAVENOUS | Status: AC
  Administered 2013-02-22: 1000 mL via INTRAVENOUS

## 2013-02-22 NOTE — ED Provider Notes (Signed)
CSN: [id number]     Arrival date & time 02/22/13  1758 History     First MD Initiated Contact with Patient 02/22/13 1911     Chief Complaint  Patient presents with  . Abdominal Pain   (Consider location/radiation/quality/duration/timing/severity/associated sxs/prior Treatment) Patient is a 52 y.o. female presenting with abdominal pain. The history is provided by the patient. No language interpreter was used.  Abdominal Pain This is a new problem. The current episode started in the past 7 days. The problem occurs constantly. The problem has been gradually worsening. Associated symptoms include abdominal pain and a sore throat. Nothing aggravates the symptoms. She has tried nothing for the symptoms. The treatment provided no relief.   Pt complains of diarrhea on and off for 4 days.   Pt reports she had for 2 days, was better yesterday but symptoms returned today.  Pt reports she has a lot of acid on her stomach.   Pt also reports sinus pressure and drainage Past Medical History  Diagnosis Date  . Thyroid disease   . Hypertension   . Stroke   . Back pain   . Hypercholesteremia   . Sleep apnea   . Arthritis   . GERD (gastroesophageal reflux disease)   . CVA (cerebral infarction)    Past Surgical History  Procedure Laterality Date  . Nasal sinus surgery    . Abdominal hysterectomy    . Carpal tunnel release    . Nasal sinus surgery     History reviewed. No pertinent family history. History  Substance Use Topics  . Smoking status: Never Smoker   . Smokeless tobacco: Never Used  . Alcohol Use: No   OB History   Grav Para Term Preterm Abortions TAB SAB Ect Mult Living                 Review of Systems  HENT: Positive for sore throat.   Gastrointestinal: Positive for abdominal pain.  All other systems reviewed and are negative.    Allergies  Review of patient's allergies indicates no known allergies.  Home Medications   Current Outpatient Rx  Name  Route  Sig   Dispense  Refill  . albuterol (PROVENTIL,VENTOLIN) 90 MCG/ACT inhaler   Inhalation   Inhale 2 puffs into the lungs every 6 (six) hours as needed. For shortness of breath         . atenolol-chlorthalidone (TENORETIC) 50-25 MG per tablet   Oral   Take 1 tablet by mouth daily.           . butalbital-acetaminophen-caffeine (FIORICET, ESGIC) 50-325-40 MG per tablet   Oral   Take 1 tablet by mouth every 4 (four) hours as needed. For headache          . colchicine 0.6 MG tablet   Oral   Take 0.6 mg by mouth daily.         . diphenhydrAMINE (BENADRYL) 25 MG tablet   Oral   Take 50 mg by mouth every 6 (six) hours as needed. For allergies         . esomeprazole (NEXIUM) 40 MG capsule   Oral   Take 40 mg by mouth daily before breakfast.           . Hydrocodone-Ibuprofen (VICOPROFEN PO)   Oral   Take by mouth every 8 (eight) hours as needed.         Marland Kitchen levothyroxine (SYNTHROID, LEVOTHROID) 100 MCG tablet   Oral   Take 100 mcg by  mouth daily.         Marland Kitchen lisinopril (PRINIVIL,ZESTRIL) 10 MG tablet   Oral   Take 10 mg by mouth daily.           . Menthol, Topical Analgesic, (BIOFREEZE) 4 % GEL   Apply externally   Apply 1 application topically 2 (two) times daily as needed. For knee pain         . potassium chloride SA (K-DUR,KLOR-CON) 20 MEQ tablet   Oral   Take 20 mEq by mouth daily.           . pravastatin (PRAVACHOL) 80 MG tablet   Oral   Take 80 mg by mouth daily.         . traMADol (ULTRAM) 50 MG tablet   Oral   Take 1 tablet (50 mg total) by mouth every 6 (six) hours as needed for pain.   15 tablet   0    BP 119/78  Pulse 73  Temp(Src) 98.1 F (36.7 C) (Oral)  Resp 16  Ht 5\' 2"  (1.575 m)  Wt 178 lb (80.74 kg)  BMI 32.55 kg/m2  SpO2 99% Physical Exam  Nursing note and vitals reviewed. Constitutional: She appears well-developed.  HENT:  Head: Normocephalic and atraumatic.  Right Ear: External ear normal.  Left Ear: External ear normal.   Nose: Nose normal.  Mouth/Throat: Oropharynx is clear and moist.  tendr maxillary sinuses  Eyes: Conjunctivae and EOM are normal. Pupils are equal, round, and reactive to light.  Neck: Normal range of motion. Neck supple.  Cardiovascular: Normal rate and normal heart sounds.   Pulmonary/Chest: Effort normal.  Abdominal: Soft.  Musculoskeletal: Normal range of motion.  Neurological: She is alert.  Skin: Skin is warm.  Psychiatric: She has a normal mood and affect.    ED Course   Procedures (including critical care time)  Labs Reviewed  URINALYSIS, ROUTINE W REFLEX MICROSCOPIC - Abnormal; Notable for the following:    Hgb urine dipstick TRACE (*)    All other components within normal limits  COMPREHENSIVE METABOLIC PANEL - Abnormal; Notable for the following:    Potassium 3.1 (*)    Glucose, Bld 109 (*)    GFR calc non Af Amer 65 (*)    GFR calc Af Amer 75 (*)    All other components within normal limits  URINE MICROSCOPIC-ADD ON  CBC   No results found. 1. Diarrhea   2. Sinusitis     MDM  Pt given Iv fluids,   Potasium x 1, zofran and lomotil.     I suspect sinus infection,   Probable viral diarrhea illness.   Pt given rx for cipro, lomotil and zofran.    Pt advised to follow up with her MD  Elson Areas, PA-C 02/22/13 2054

## 2013-02-22 NOTE — ED Notes (Signed)
Pt c/o abd cramping and diarrhea only also c/o sinus congestion

## 2013-02-22 NOTE — ED Notes (Signed)
PA at bedside now  

## 2013-02-23 NOTE — ED Provider Notes (Signed)
Medical screening examination/treatment/procedure(s) were performed by non-physician practitioner and as supervising physician I was immediately available for consultation/collaboration.   Gwyneth Sprout, MD 02/23/13 [phone]

## 2013-02-24 ENCOUNTER — Telehealth (HOSPITAL_COMMUNITY): Payer: Self-pay | Admitting: Emergency Medicine

## 2013-05-12 IMAGING — CT CT HEAD W/O CM
1 series · 15 of 30 positions shown, 19 images · non-contrast
Comparison: Skull series 03/07/2011.

CLINICAL DATA: 50-year-old female with abnormal sensation on the
left side of the body.

CT HEAD WITHOUT CONTRAST
TECHNIQUE: Contiguous axial images were obtained from the base of
the skull through the vertex without contrast.

[Series 2: head 4.8 h37s · axial · 0.41mm/px · z∈[+1097,+1249]mm · 15 of 36 slices shown, 19 images]
[im 2/36  brain]
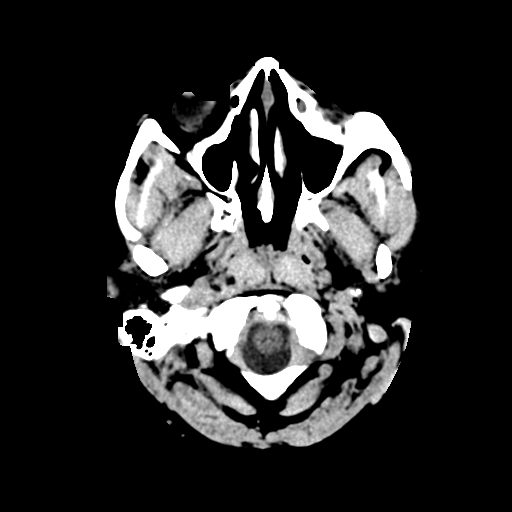
[im 2/36  bone]
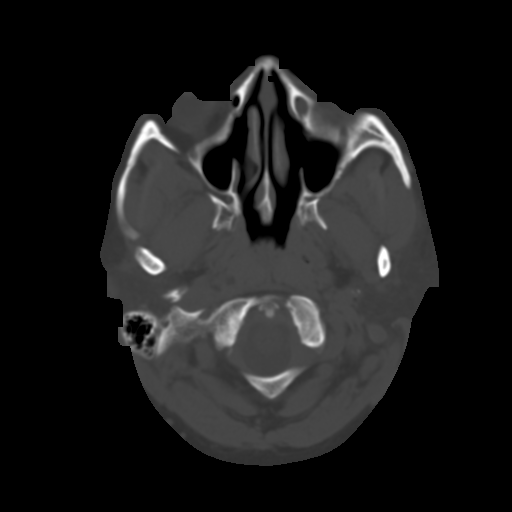
[im 4/36  brain]
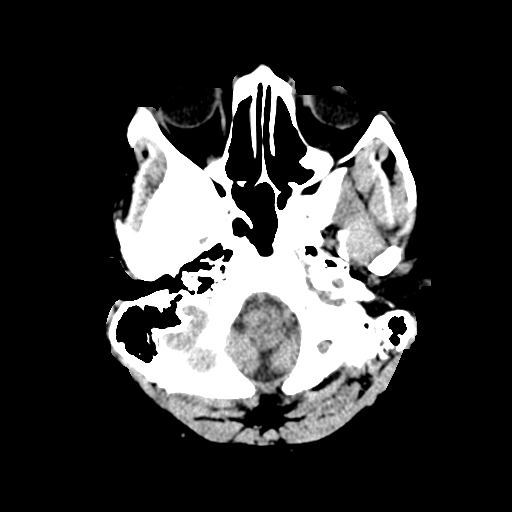
[im 7/36  brain]
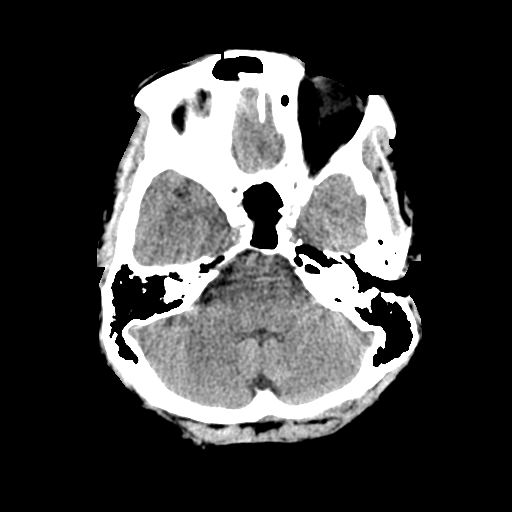
[im 9/36  brain]
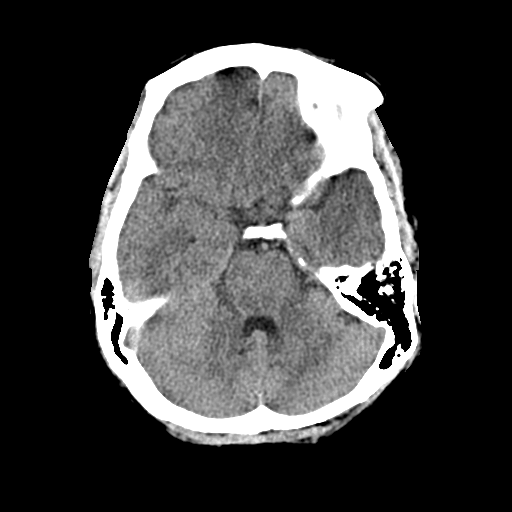
[im 11/36  brain]
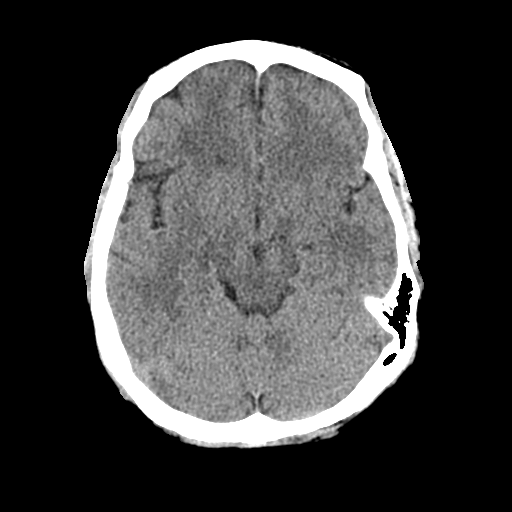
[im 11/36  bone]
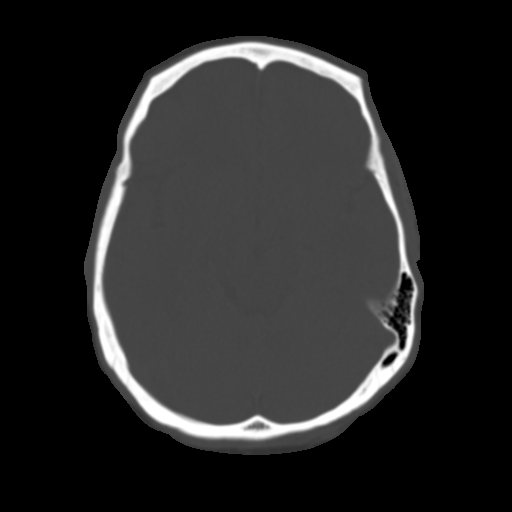
[im 14/36  brain]
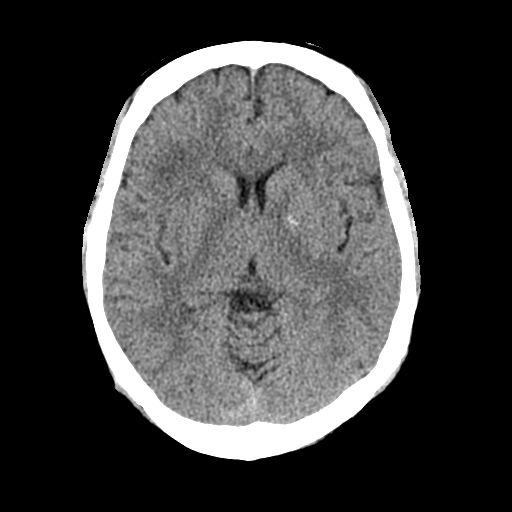
[im 16/36  brain]
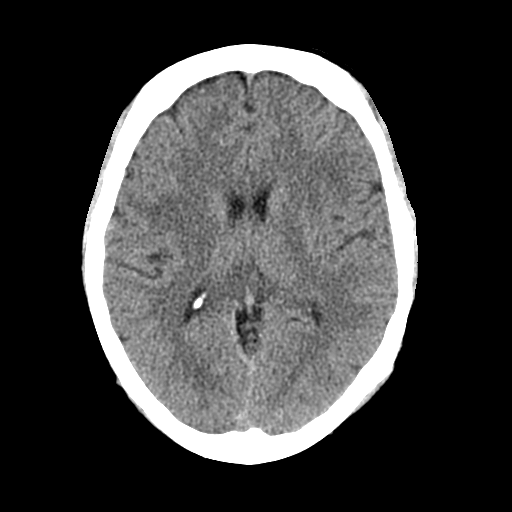
[im 19/36  brain]
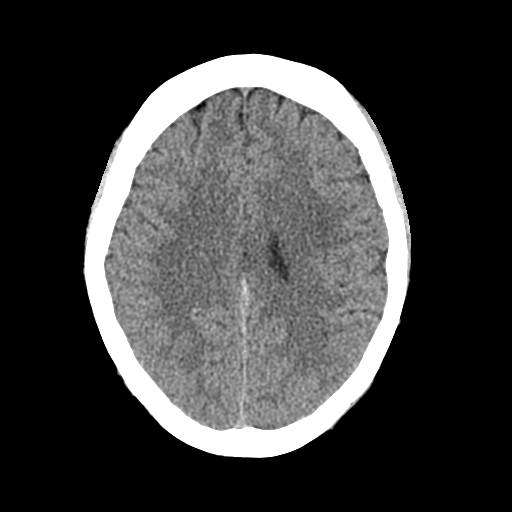
[im 20/36  brain]
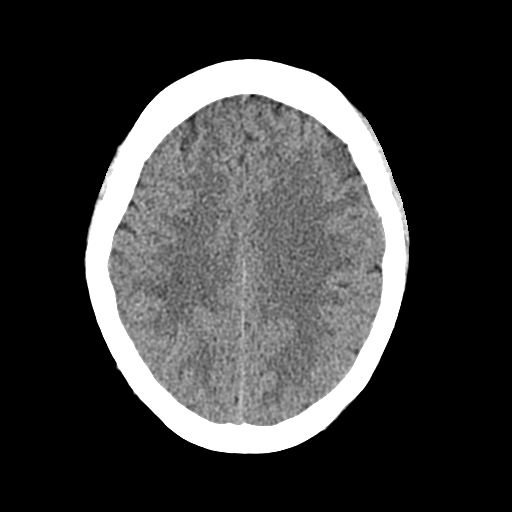
[im 20/36  bone]
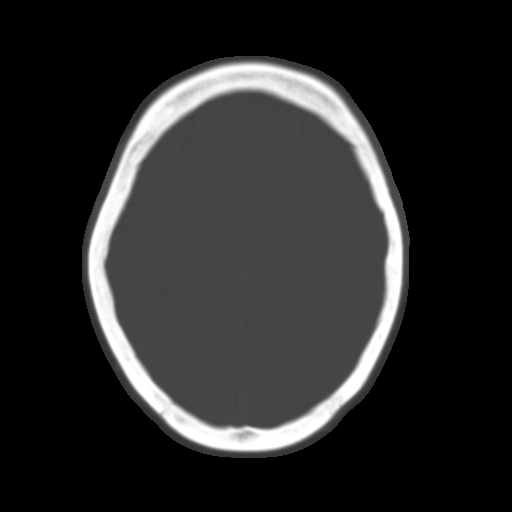
[im 22/36  brain]
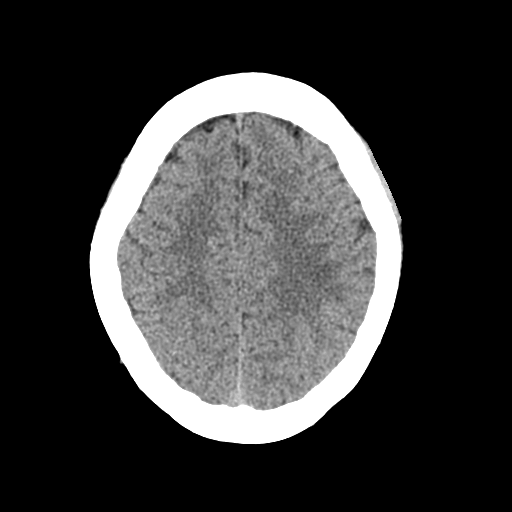
[im 25/36  brain]
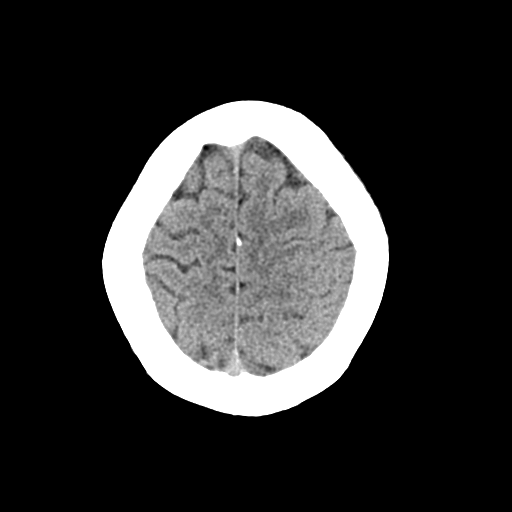
[im 27/36  brain]
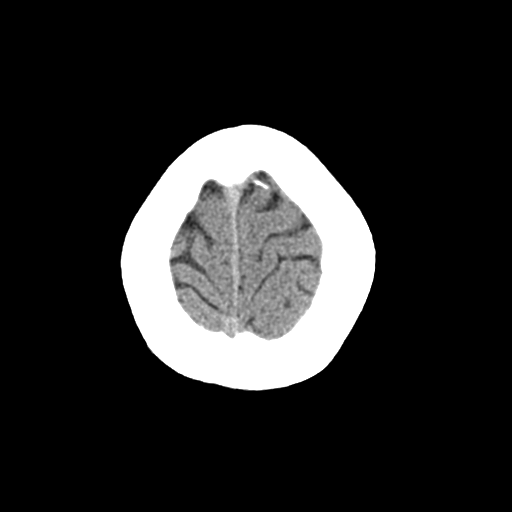
[im 29/36  brain]
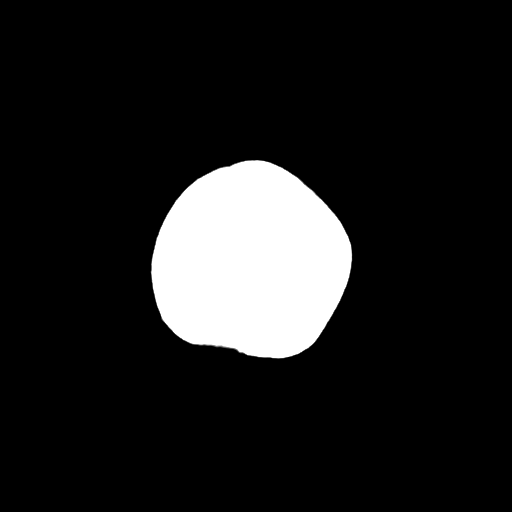
[im 29/36  bone]
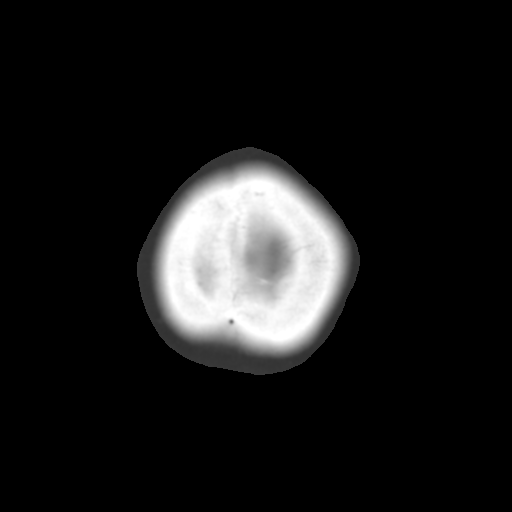
[im 32/36  brain]
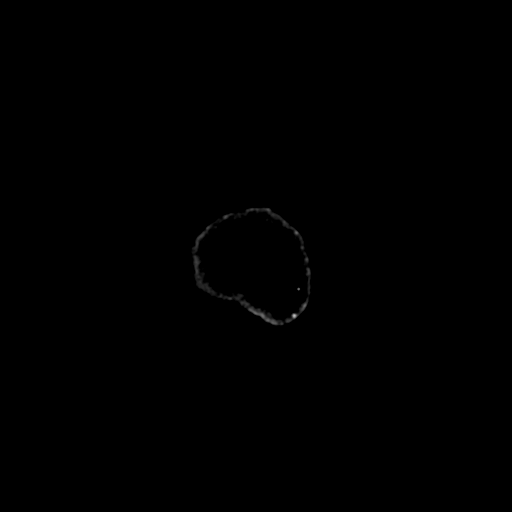
[im 34/36  brain]
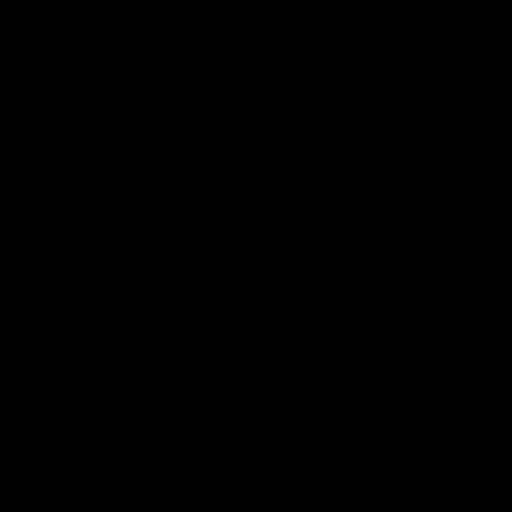

[15 of 30 positions shown; findings below may reference images not displayed]

FINDINGS: Minimal paranasal sinus mucosal thickening; previous
maxillary antrostomies and ethmoidectomies.  The left frontal sinus
is hypoplastic.  The mastoids are clear.

No acute osseous abnormality identified.  Visualized orbits and
scalp soft tissues are within normal limits.  Dystrophic basal
ganglia calcifications.  Normal cerebral volume.  No
ventriculomegaly. No midline shift, mass effect, or evidence of
mass lesion.  No acute intracranial hemorrhage identified.  No
evidence of cortically based acute infarction identified.  Gray-
white matter differentiation is within normal limits throughout the
brain.  Occasional dural calcifications. No suspicious intracranial
vascular hyperdensity.
IMPRESSION: 1. Normal noncontrast CT appearance of the brain.
2.  Previous paranasal sinus surgery with minimal mucosal
thickening.

## 2013-07-29 ENCOUNTER — Emergency Department (HOSPITAL_BASED_OUTPATIENT_CLINIC_OR_DEPARTMENT_OTHER): Payer: Medicare Other

## 2013-07-29 ENCOUNTER — Emergency Department (HOSPITAL_BASED_OUTPATIENT_CLINIC_OR_DEPARTMENT_OTHER)
Admission: EM | Admit: 2013-07-29 | Discharge: 2013-07-29 | Disposition: A | Discharge status: Home / Self Care | Payer: Medicare Other | Attending: Emergency Medicine | Admitting: Emergency Medicine

## 2013-07-29 ENCOUNTER — Encounter (HOSPITAL_BASED_OUTPATIENT_CLINIC_OR_DEPARTMENT_OTHER): Payer: Self-pay | Admitting: Emergency Medicine

## 2013-07-29 DIAGNOSIS — J40 Bronchitis, not specified as acute or chronic: Secondary | ICD-10-CM | POA: Insufficient documentation

## 2013-07-29 DIAGNOSIS — E079 Disorder of thyroid, unspecified: Secondary | ICD-10-CM | POA: Insufficient documentation

## 2013-07-29 DIAGNOSIS — I10 Essential (primary) hypertension: Secondary | ICD-10-CM | POA: Insufficient documentation

## 2013-07-29 DIAGNOSIS — J9801 Acute bronchospasm: Secondary | ICD-10-CM | POA: Insufficient documentation

## 2013-07-29 DIAGNOSIS — Z792 Long term (current) use of antibiotics: Secondary | ICD-10-CM | POA: Insufficient documentation

## 2013-07-29 DIAGNOSIS — E7800 Pure hypercholesterolemia, unspecified: Secondary | ICD-10-CM | POA: Insufficient documentation

## 2013-07-29 DIAGNOSIS — M129 Arthropathy, unspecified: Secondary | ICD-10-CM | POA: Insufficient documentation

## 2013-07-29 DIAGNOSIS — Z79899 Other long term (current) drug therapy: Secondary | ICD-10-CM | POA: Insufficient documentation

## 2013-07-29 DIAGNOSIS — K219 Gastro-esophageal reflux disease without esophagitis: Secondary | ICD-10-CM | POA: Insufficient documentation

## 2013-07-29 DIAGNOSIS — J209 Acute bronchitis, unspecified: Secondary | ICD-10-CM

## 2013-07-29 DIAGNOSIS — Z8673 Personal history of transient ischemic attack (TIA), and cerebral infarction without residual deficits: Secondary | ICD-10-CM | POA: Insufficient documentation

## 2013-07-29 MED ORDER — PREDNISONE 50 MG PO TABS
60.0000 mg | ORAL_TABLET | Freq: Once | ORAL | Status: AC
  Administered 2013-07-29: 60 mg via ORAL
  Filled 2013-07-29 (×2): qty 1

## 2013-07-29 MED ORDER — GUAIFENESIN-CODEINE 100-10 MG/5ML PO SOLN
5.0000 mL | Freq: Once | ORAL | Status: AC
  Administered 2013-07-29: 5 mL via ORAL
  Filled 2013-07-29: qty 5

## 2013-07-29 MED ORDER — GUAIFENESIN-CODEINE 100-10 MG/5ML PO SYRP: 5.0000 mL | ORAL_SOLUTION | Freq: Three times a day (TID) | ORAL | Status: DC | PRN

## 2013-07-29 MED ORDER — PREDNISONE 10 MG PO TABS: 20.0000 mg | ORAL_TABLET | Freq: Two times a day (BID) | ORAL | Status: DC

## 2013-07-29 NOTE — ED Notes (Signed)
PA back at bedside for reassessment 

## 2013-07-29 NOTE — ED Notes (Signed)
Cough and congestion x 1 week- saw pcp on Tuesday and was started on amoxicillin

## 2013-07-29 NOTE — ED Provider Notes (Signed)
CSN: [id number]     Arrival date & time 07/29/13  1751 History   First MD Initiated Contact with Patient 07/29/13 1841     Chief Complaint  Patient presents with  . Cough   (Consider location/radiation/quality/duration/timing/severity/associated sxs/prior Treatment) Patient is a 53 y.o. female presenting with cough. The history is provided by the patient.  Cough Cough characteristics:  Productive Sputum characteristics:  Yellow Severity:  Moderate Onset quality:  Gradual Duration:  1 week Timing:  Constant Progression:  Worsening Chronicity:  New Smoker: no   Context: sick contacts   Relieved by:  Nothing Worsened by:  Lying down Ineffective treatments:  Beta-agonist inhaler Associated symptoms: rhinorrhea   Associated symptoms: no chills, no fever and no sore throat    Tiffany Larsen is a 53 y.o. female who presents to the ED with cough and congestion that started one week ago. She went to her PCP 4 days ago and was started on Amoxicillin. She continues to cough and can not sleep at all at night due to the cough. Feels like she has coughing spasm and can't stop.  Past Medical History  Diagnosis Date  . Thyroid disease   . Hypertension   . Stroke   . Back pain   . Hypercholesteremia   . Sleep apnea   . Arthritis   . GERD (gastroesophageal reflux disease)   . CVA (cerebral infarction)    Past Surgical History  Procedure Laterality Date  . Nasal sinus surgery    . Abdominal hysterectomy    . Carpal tunnel release    . Nasal sinus surgery     No family history on file. History  Substance Use Topics  . Smoking status: Never Smoker   . Smokeless tobacco: Never Used  . Alcohol Use: No   OB History   Grav Para Term Preterm Abortions TAB SAB Ect Mult Living                 Review of Systems  Constitutional: Negative for fever and chills.  HENT: Positive for congestion and rhinorrhea. Negative for sore throat and trouble swallowing.   Eyes: Negative for redness and  itching.  Respiratory: Positive for cough.   Gastrointestinal: Negative for nausea, vomiting and abdominal pain.  Psychiatric/Behavioral: Negative for confusion. The patient is not nervous/anxious.     Allergies  Review of patient's allergies indicates no known allergies.  Home Medications   Current Outpatient Rx  Name  Route  Sig  Dispense  Refill  . albuterol (PROVENTIL,VENTOLIN) 90 MCG/ACT inhaler   Inhalation   Inhale 2 puffs into the lungs every 6 (six) hours as needed. For shortness of breath         . ALPRAZolam (XANAX) 0.25 MG tablet   Oral   Take 0.25 mg by mouth 3 (three) times daily as needed for anxiety.         Marland Kitchen amoxicillin (AMOXIL) 500 MG capsule   Oral   Take 500 mg by mouth 3 (three) times daily.         Marland Kitchen atenolol-chlorthalidone (TENORETIC) 50-25 MG per tablet   Oral   Take 1 tablet by mouth daily.           . butalbital-acetaminophen-caffeine (FIORICET, ESGIC) 50-325-40 MG per tablet   Oral   Take 1 tablet by mouth every 4 (four) hours as needed. For headache          . diphenhydrAMINE (BENADRYL) 25 MG tablet   Oral   Take 50  mg by mouth every 6 (six) hours as needed. For allergies         . esomeprazole (NEXIUM) 40 MG capsule   Oral   Take 40 mg by mouth daily before breakfast.           . Hydrocodone-Ibuprofen (VICOPROFEN PO)   Oral   Take by mouth every 8 (eight) hours as needed.         Marland Kitchen. levothyroxine (SYNTHROID, LEVOTHROID) 100 MCG tablet   Oral   Take 100 mcg by mouth daily.         Marland Kitchen. lisinopril (PRINIVIL,ZESTRIL) 10 MG tablet   Oral   Take 10 mg by mouth daily.           . Menthol, Topical Analgesic, (BIOFREEZE) 4 % GEL   Apply externally   Apply 1 application topically 2 (two) times daily as needed. For knee pain         . potassium chloride SA (K-DUR,KLOR-CON) 20 MEQ tablet   Oral   Take 20 mEq by mouth daily.           . pravastatin (PRAVACHOL) 80 MG tablet   Oral   Take 80 mg by mouth daily.          . ciprofloxacin (CIPRO) 500 MG tablet   Oral   Take 1 tablet (500 mg total) by mouth 2 (two) times daily.   14 tablet   0   . colchicine 0.6 MG tablet   Oral   Take 0.6 mg by mouth daily.         . diphenoxylate-atropine (LOMOTIL) 2.5-0.025 MG per tablet   Oral   Take 1 tablet by mouth 4 (four) times daily as needed for diarrhea or loose stools.   20 tablet   0   . ondansetron (ZOFRAN ODT) 4 MG disintegrating tablet   Oral   Take 1 tablet (4 mg total) by mouth every 8 (eight) hours as needed for nausea.   20 tablet   0   . traMADol (ULTRAM) 50 MG tablet   Oral   Take 1 tablet (50 mg total) by mouth every 6 (six) hours as needed for pain.   15 tablet   0    BP 135/73  Pulse 89  Temp(Src) 98.9 F (37.2 C) (Oral)  Resp 20  Ht 5\' 3"  (1.6 m)  Wt 178 lb (80.74 kg)  BMI 31.54 kg/m2  SpO2 100% Physical Exam  Nursing note and vitals reviewed. Constitutional: She is oriented to person, place, and time. She appears well-developed and well-nourished. No distress.  HENT:  Head: Normocephalic and atraumatic.  Eyes: Conjunctivae and EOM are normal.  Neck: Normal range of motion. Neck supple.  Cardiovascular: Normal rate and regular rhythm.   Pulmonary/Chest: Effort normal. She has no decreased breath sounds. Wheezes: occasional. She has no rales.  Abdominal: Soft. There is no tenderness.  Musculoskeletal: Normal range of motion.  Neurological: She is alert and oriented to person, place, and time. No cranial nerve deficit.  Skin: Skin is warm and dry.  Psychiatric: She has a normal mood and affect. Her behavior is normal.    ED Course: patient given prednisone and Robitussin AC prior to d/c.   Procedures Dg Chest 2 View  07/29/2013   CLINICAL DATA:  Cough.  Chest pain.  EXAM: CHEST  2 VIEW  COMPARISON:  09/23/2011  FINDINGS: The heart size and mediastinal contours are within normal limits. Both lungs are clear. The visualized skeletal structures  are unremarkable.   IMPRESSION: No active cardiopulmonary disease.   Electronically Signed   By: Myles Rosenthal M.D.   On: 07/29/2013 18:46    MDM  53 y.o. female with cough and congestion. Cough keeps her awake at night. She will continue her antibiotics, albuterol and will start prednisone and Robitussin AC.  I have reviewed this patient's vital signs, nurses notes, appropriate labs and imaging.  I discussed finding and plan of care with the patient. She voices understanding.    Medication List    TAKE these medications       guaiFENesin-codeine 100-10 MG/5ML syrup  Commonly known as:  ROBITUSSIN AC  Take 5 mLs by mouth 3 (three) times daily as needed for cough.     predniSONE 10 MG tablet  Commonly known as:  DELTASONE  Take 2 tablets (20 mg total) by mouth 2 (two) times daily with a meal.      ASK your doctor about these medications       albuterol 90 MCG/ACT inhaler  Commonly known as:  PROVENTIL,VENTOLIN  Inhale 2 puffs into the lungs every 6 (six) hours as needed. For shortness of breath     ALPRAZolam 0.25 MG tablet  Commonly known as:  XANAX  Take 0.25 mg by mouth 3 (three) times daily as needed for anxiety.     amoxicillin 500 MG capsule  Commonly known as:  AMOXIL  Take 500 mg by mouth 3 (three) times daily.     atenolol-chlorthalidone 50-25 MG per tablet  Commonly known as:  TENORETIC  Take 1 tablet by mouth daily.     BIOFREEZE 4 % Gel  Generic drug:  Menthol (Topical Analgesic)  Apply 1 application topically 2 (two) times daily as needed. For knee pain     butalbital-acetaminophen-caffeine 50-325-40 MG per tablet  Commonly known as:  FIORICET, ESGIC  Take 1 tablet by mouth every 4 (four) hours as needed. For headache     ciprofloxacin 500 MG tablet  Commonly known as:  CIPRO  Take 1 tablet (500 mg total) by mouth 2 (two) times daily.     colchicine 0.6 MG tablet  Take 0.6 mg by mouth daily.     diphenhydrAMINE 25 MG tablet  Commonly known as:  BENADRYL  Take 50 mg by  mouth every 6 (six) hours as needed. For allergies     diphenoxylate-atropine 2.5-0.025 MG per tablet  Commonly known as:  LOMOTIL  Take 1 tablet by mouth 4 (four) times daily as needed for diarrhea or loose stools.     esomeprazole 40 MG capsule  Commonly known as:  NEXIUM  Take 40 mg by mouth daily before breakfast.     levothyroxine 100 MCG tablet  Commonly known as:  SYNTHROID, LEVOTHROID  Take 100 mcg by mouth daily.     lisinopril 10 MG tablet  Commonly known as:  PRINIVIL,ZESTRIL  Take 10 mg by mouth daily.     ondansetron 4 MG disintegrating tablet  Commonly known as:  ZOFRAN ODT  Take 1 tablet (4 mg total) by mouth every 8 (eight) hours as needed for nausea.     potassium chloride SA 20 MEQ tablet  Commonly known as:  K-DUR,KLOR-CON  Take 20 mEq by mouth daily.     pravastatin 80 MG tablet  Commonly known as:  PRAVACHOL  Take 80 mg by mouth daily.     traMADol 50 MG tablet  Commonly known as:  ULTRAM  Take 1 tablet (50 mg total)  by mouth every 6 (six) hours as needed for pain.     VICOPROFEN PO  Take by mouth every 8 (eight) hours as needed.            [address] [place], [place] 07/30/13 [phone]

## 2013-07-29 NOTE — Discharge Instructions (Signed)
Acute Bronchitis Bronchitis is inflammation of the airways that extend from the windpipe into the lungs (bronchi). The inflammation often causes mucus to develop. This leads to a cough, which is the most common symptom of bronchitis.  In acute bronchitis, the condition usually develops suddenly and goes away over time, usually in a couple weeks. Smoking, allergies, and asthma can make bronchitis worse. Repeated episodes of bronchitis may cause further lung problems.  CAUSES Acute bronchitis is most often caused by the same virus that causes a cold. The virus can spread from person to person (contagious).  SIGNS AND SYMPTOMS   Cough.   Fever.   Coughing up mucus.   Body aches.   Chest congestion.   Chills.   Shortness of breath.   Sore throat.  DIAGNOSIS  Acute bronchitis is usually diagnosed through a physical exam. Tests, such as chest X-rays, are sometimes done to rule out other conditions.  TREATMENT  Acute bronchitis usually goes away in a couple weeks. Often times, no medical treatment is necessary. Medicines are sometimes given for relief of fever or cough. Antibiotics are usually not needed but may be prescribed in certain situations. In some cases, an inhaler may be recommended to help reduce shortness of breath and control the cough. A cool mist vaporizer may also be used to help thin bronchial secretions and make it easier to clear the chest.  HOME CARE INSTRUCTIONS  Get plenty of rest.   Drink enough fluids to keep your urine clear or pale yellow (unless you have a medical condition that requires fluid restriction). Increasing fluids may help thin your secretions and will prevent dehydration.   Only take over-the-counter or prescription medicines as directed by your health care provider.   Avoid smoking and secondhand smoke. Exposure to cigarette smoke or irritating chemicals will make bronchitis worse. If you are a smoker, consider using nicotine gum or skin  patches to help control withdrawal symptoms. Quitting smoking will help your lungs heal faster.   Reduce the chances of another bout of acute bronchitis by washing your hands frequently, avoiding people with cold symptoms, and trying not to touch your hands to your mouth, nose, or eyes.   Follow up with your health care provider as directed.  SEEK MEDICAL CARE IF: Your symptoms do not improve after 1 week of treatment.  SEEK IMMEDIATE MEDICAL CARE IF:  You develop an increased fever or chills.   You have chest pain.   You have severe shortness of breath.  You have bloody sputum.   You develop dehydration.  You develop fainting.  You develop repeated vomiting.  You develop a severe headache. MAKE SURE YOU:   Understand these instructions.  Will watch your condition.  Will get help right away if you are not doing well or get worse. Document Released: 08/20/2004 Document Revised: 03/15/2013 Document Reviewed: 01/03/2013 [employer] Patient Information 2014 [employer].  

## 2013-07-30 NOTE — ED Provider Notes (Signed)
Medical screening examination/treatment/procedure(s) were performed by non-physician practitioner and as supervising physician I was immediately available for consultation/collaboration.  EKG Interpretation   None        Doug SouSam Idania Desouza, MD 07/30/13 [phone]

## 2013-10-28 ENCOUNTER — Encounter (HOSPITAL_BASED_OUTPATIENT_CLINIC_OR_DEPARTMENT_OTHER): Payer: Self-pay | Admitting: Emergency Medicine

## 2013-10-28 ENCOUNTER — Emergency Department (HOSPITAL_BASED_OUTPATIENT_CLINIC_OR_DEPARTMENT_OTHER)
Admission: EM | Admit: 2013-10-28 | Discharge: 2013-10-29 | Disposition: A | Discharge status: Home / Self Care | Payer: Medicare Other | Attending: Emergency Medicine | Admitting: Emergency Medicine

## 2013-10-28 DIAGNOSIS — Y939 Activity, unspecified: Secondary | ICD-10-CM | POA: Diagnosis not present

## 2013-10-28 DIAGNOSIS — E079 Disorder of thyroid, unspecified: Secondary | ICD-10-CM | POA: Insufficient documentation

## 2013-10-28 DIAGNOSIS — I10 Essential (primary) hypertension: Secondary | ICD-10-CM | POA: Insufficient documentation

## 2013-10-28 DIAGNOSIS — Z792 Long term (current) use of antibiotics: Secondary | ICD-10-CM | POA: Insufficient documentation

## 2013-10-28 DIAGNOSIS — IMO0002 Reserved for concepts with insufficient information to code with codable children: Secondary | ICD-10-CM | POA: Diagnosis not present

## 2013-10-28 DIAGNOSIS — S91309A Unspecified open wound, unspecified foot, initial encounter: Secondary | ICD-10-CM | POA: Insufficient documentation

## 2013-10-28 DIAGNOSIS — Y929 Unspecified place or not applicable: Secondary | ICD-10-CM | POA: Diagnosis not present

## 2013-10-28 DIAGNOSIS — S91339A Puncture wound without foreign body, unspecified foot, initial encounter: Secondary | ICD-10-CM

## 2013-10-28 DIAGNOSIS — Z23 Encounter for immunization: Secondary | ICD-10-CM | POA: Insufficient documentation

## 2013-10-28 DIAGNOSIS — S8990XA Unspecified injury of unspecified lower leg, initial encounter: Secondary | ICD-10-CM | POA: Diagnosis present

## 2013-10-28 DIAGNOSIS — W268XXA Contact with other sharp object(s), not elsewhere classified, initial encounter: Secondary | ICD-10-CM | POA: Insufficient documentation

## 2013-10-28 DIAGNOSIS — E7800 Pure hypercholesterolemia, unspecified: Secondary | ICD-10-CM | POA: Insufficient documentation

## 2013-10-28 DIAGNOSIS — Z8673 Personal history of transient ischemic attack (TIA), and cerebral infarction without residual deficits: Secondary | ICD-10-CM | POA: Insufficient documentation

## 2013-10-28 DIAGNOSIS — M129 Arthropathy, unspecified: Secondary | ICD-10-CM | POA: Diagnosis not present

## 2013-10-28 DIAGNOSIS — K219 Gastro-esophageal reflux disease without esophagitis: Secondary | ICD-10-CM | POA: Insufficient documentation

## 2013-10-28 DIAGNOSIS — Z79899 Other long term (current) drug therapy: Secondary | ICD-10-CM | POA: Insufficient documentation

## 2013-10-28 DIAGNOSIS — S99919A Unspecified injury of unspecified ankle, initial encounter: Secondary | ICD-10-CM | POA: Diagnosis present

## 2013-10-28 NOTE — ED Notes (Signed)
Pt reports she stepped on nail that went through her she- ?needs tetanus

## 2013-10-29 DIAGNOSIS — S91309A Unspecified open wound, unspecified foot, initial encounter: Secondary | ICD-10-CM | POA: Diagnosis not present

## 2013-10-29 MED ORDER — TETANUS-DIPHTH-ACELL PERTUSSIS 5-2.5-18.5 LF-MCG/0.5 IM SUSP
0.5000 mL | Freq: Once | INTRAMUSCULAR | Status: AC
  Administered 2013-10-29: 0.5 mL via INTRAMUSCULAR
  Filled 2013-10-29: qty 0.5

## 2013-10-29 NOTE — ED Provider Notes (Signed)
CSN: [id number]     Arrival date & time 10/28/13  2328 History   First MD Initiated Contact with Patient 10/29/13 0110     Chief Complaint  Patient presents with  . Foot Injury     (Consider location/radiation/quality/duration/timing/severity/associated sxs/prior Treatment) HPI Is a 53 year old female who stepped on a nail about 6:30 yesterday evening. The nail punctured her shoe and she thinks it just barely broke the skin of her left foot near the heel. There was no bleeding. There is no pain. She is here with concern about a tetanus shot.  Past Medical History  Diagnosis Date  . Thyroid disease   . Hypertension   . Stroke   . Back pain   . Hypercholesteremia   . Sleep apnea   . Arthritis   . GERD (gastroesophageal reflux disease)   . CVA (cerebral infarction)    Past Surgical History  Procedure Laterality Date  . Nasal sinus surgery    . Abdominal hysterectomy    . Carpal tunnel release    . Nasal sinus surgery    . Knee surgery     No family history on file. History  Substance Use Topics  . Smoking status: Never Smoker   . Smokeless tobacco: Never Used  . Alcohol Use: No   OB History   Grav Para Term Preterm Abortions TAB SAB Ect Mult Living                 Review of Systems  All other systems reviewed and are negative.   Allergies  Review of patient's allergies indicates no known allergies.  Home Medications   Current Outpatient Rx  Name  Route  Sig  Dispense  Refill  . albuterol (PROVENTIL,VENTOLIN) 90 MCG/ACT inhaler   Inhalation   Inhale 2 puffs into the lungs every 6 (six) hours as needed. For shortness of breath         . ALPRAZolam (XANAX) 0.25 MG tablet   Oral   Take 0.25 mg by mouth 3 (three) times daily as needed for anxiety.         Marland Kitchen atenolol-chlorthalidone (TENORETIC) 50-25 MG per tablet   Oral   Take 1 tablet by mouth daily.           Marland Kitchen atorvastatin (LIPITOR) 80 MG tablet   Oral   Take 80 mg by mouth daily.         .  butalbital-acetaminophen-caffeine (FIORICET, ESGIC) 50-325-40 MG per tablet   Oral   Take 1 tablet by mouth every 4 (four) hours as needed. For headache          . colchicine 0.6 MG tablet   Oral   Take 0.6 mg by mouth daily.         . diphenhydrAMINE (BENADRYL) 25 MG tablet   Oral   Take 50 mg by mouth every 6 (six) hours as needed. For allergies         . esomeprazole (NEXIUM) 40 MG capsule   Oral   Take 40 mg by mouth daily before breakfast.           . Hydrocodone-Ibuprofen (VICOPROFEN PO)   Oral   Take by mouth every 8 (eight) hours as needed.         Marland Kitchen levothyroxine (SYNTHROID, LEVOTHROID) 100 MCG tablet   Oral   Take 100 mcg by mouth daily.         Marland Kitchen lisinopril (PRINIVIL,ZESTRIL) 10 MG tablet   Oral   Take  10 mg by mouth daily.           . Menthol, Topical Analgesic, (BIOFREEZE) 4 % GEL   Apply externally   Apply 1 application topically 2 (two) times daily as needed. For knee pain         . potassium chloride SA (K-DUR,KLOR-CON) 20 MEQ tablet   Oral   Take 20 mEq by mouth daily.           . pravastatin (PRAVACHOL) 80 MG tablet   Oral   Take 80 mg by mouth daily.         Marland Kitchen. amoxicillin (AMOXIL) 500 MG capsule   Oral   Take 500 mg by mouth 3 (three) times daily.         . ciprofloxacin (CIPRO) 500 MG tablet   Oral   Take 1 tablet (500 mg total) by mouth 2 (two) times daily.   14 tablet   0   . diphenoxylate-atropine (LOMOTIL) 2.5-0.025 MG per tablet   Oral   Take 1 tablet by mouth 4 (four) times daily as needed for diarrhea or loose stools.   20 tablet   0   . guaiFENesin-codeine (ROBITUSSIN AC) 100-10 MG/5ML syrup   Oral   Take 5 mLs by mouth 3 (three) times daily as needed for cough.   120 mL   0   . ondansetron (ZOFRAN ODT) 4 MG disintegrating tablet   Oral   Take 1 tablet (4 mg total) by mouth every 8 (eight) hours as needed for nausea.   20 tablet   0   . predniSONE (DELTASONE) 10 MG tablet   Oral   Take 2 tablets  (20 mg total) by mouth 2 (two) times daily with a meal.   16 tablet   0   . traMADol (ULTRAM) 50 MG tablet   Oral   Take 1 tablet (50 mg total) by mouth every 6 (six) hours as needed for pain.   15 tablet   0    BP 145/77  Pulse 77  Temp(Src) 97.8 F (36.6 C) (Oral)  Resp 18  Ht 5\' 2"  (1.575 m)  Wt 180 lb (81.647 kg)  BMI 32.91 kg/m2  SpO2 100%  Physical Exam General: Well-developed, well-nourished female in no acute distress; appearance consistent with age of record HENT: normocephalic; atraumatic Eyes: Normal appearance Neck: supple Heart: regular rate and rhythm Lungs: clear to auscultation bilaterally Abdomen: soft; nondistended; nontender; bowel sounds present Extremities: No deformity; full range of motion Neurologic: Awake, alert and oriented; motor function intact in all extremities and symmetric; no facial droop Skin: Warm and dry; normal-appearing skin at site of possible puncture wound Psychiatric: Normal mood and affect    ED Course  Procedures (including critical care time) The patient's wound does not appear deep enough to require a systemic antibiotic at this time. We will advise treating it topically with Neosporin and return should evidence of infection occur.    Hanley SeamenJohn L Jakyri Brunkhorst, MD 10/29/13 [phone]

## 2013-11-13 IMAGING — CT CT ABD-PELV W/ CM
2 of 5 series · 16 of 46 positions shown, 18 images · IV contrast (APPLIED)
Comparison: CT of the pelvis performed 09/08/2008

CLINICAL DATA: Mid abdominal pain, nausea and vomiting.

CT ABDOMEN AND PELVIS WITH CONTRAST
TECHNIQUE: Multidetector CT imaging of the abdomen and pelvis was
performed following the standard protocol during bolus
administration of intravenous contrast.
Contrast: 100 mL of Omnipaque 300 IV contrast

[Series 2: abd/pelvis 5.0 b31f · axial · 0.79mm/px · z∈[+750,+1135]mm · 13 of 87 slices shown, 15 images]
[im 5/87  soft-tissue]
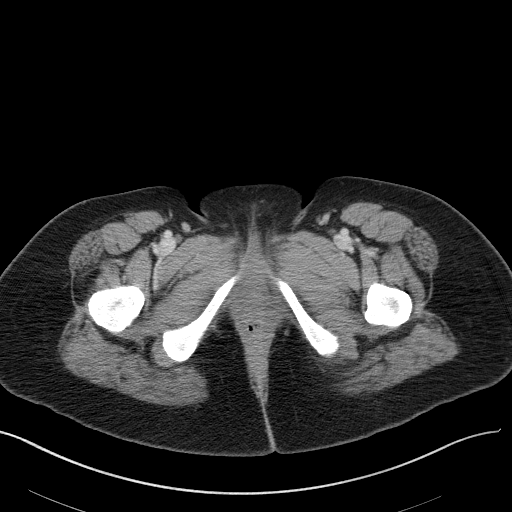
[im 5/87  bone]
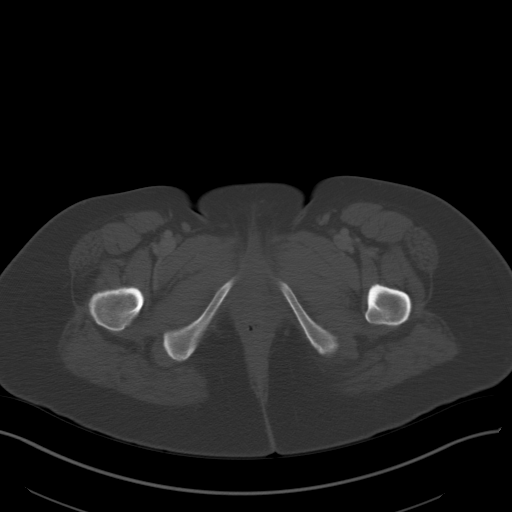
[im 14/87  soft-tissue]
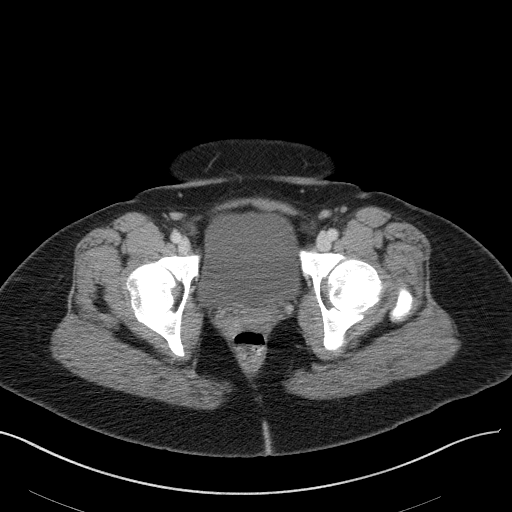
[im 19/87  soft-tissue]
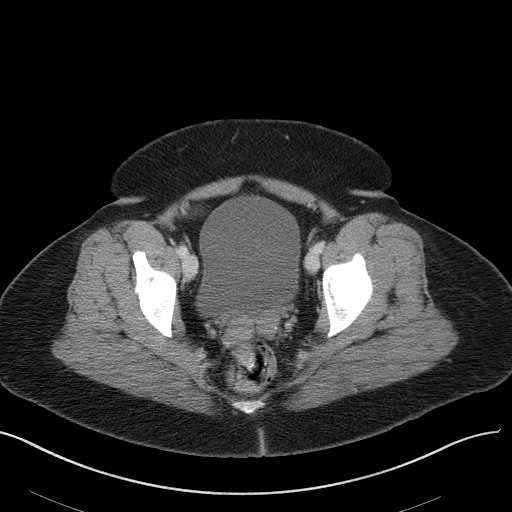
[im 23/87  soft-tissue]
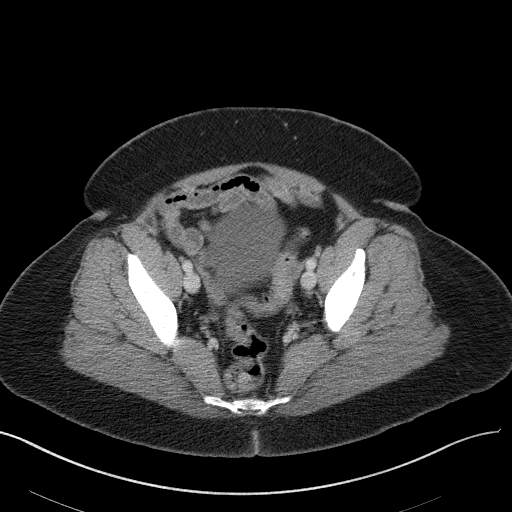
[im 32/87  soft-tissue]
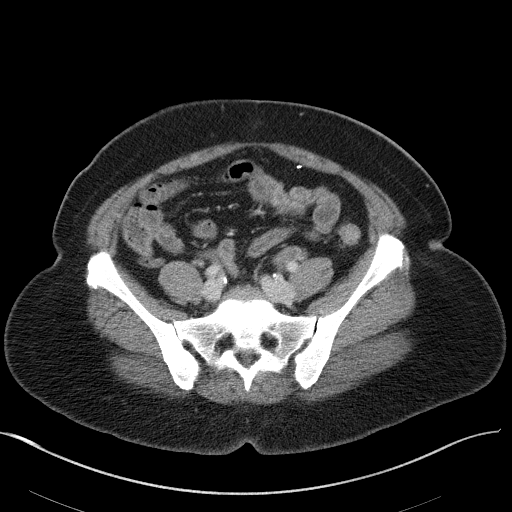
[im 37/87  soft-tissue]
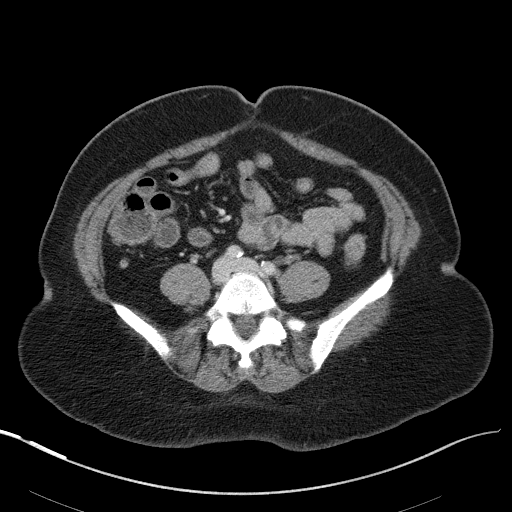
[im 46/87  soft-tissue]
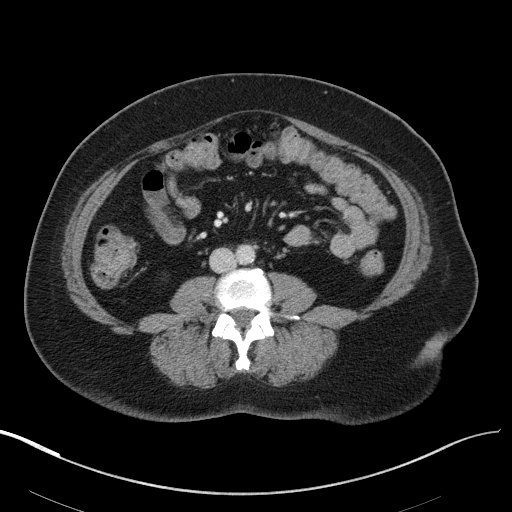
[im 50/87  soft-tissue]
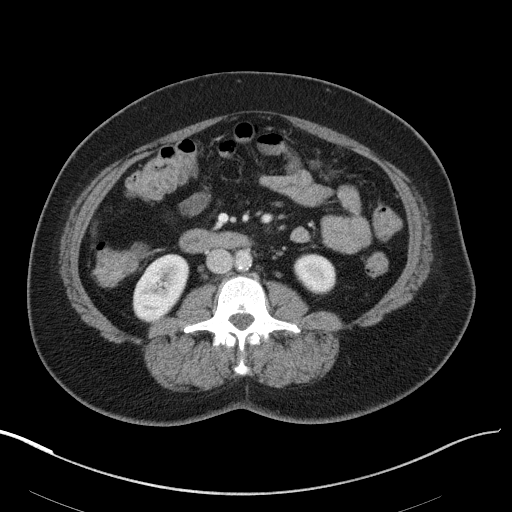
[im 55/87  soft-tissue]
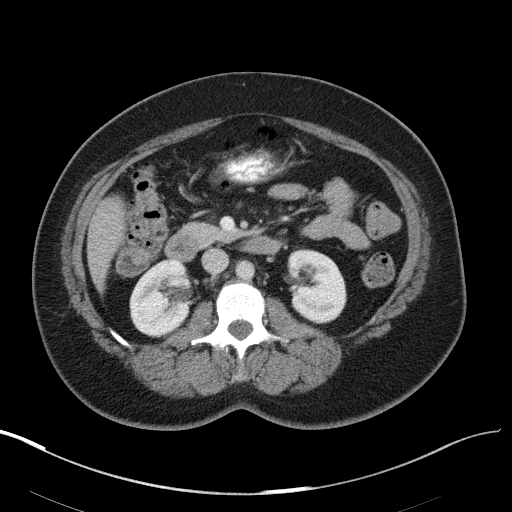
[im 55/87  bone]
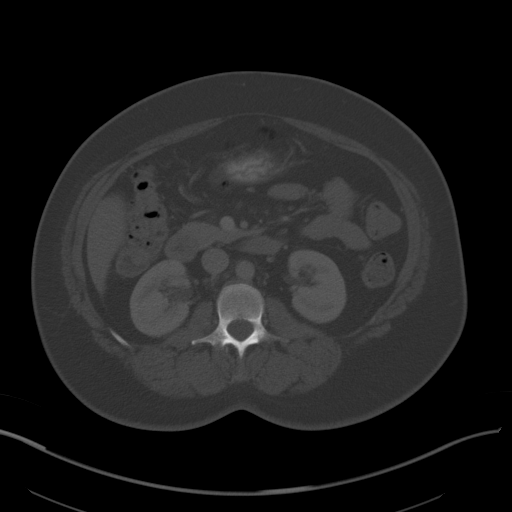
[im 64/87  soft-tissue]
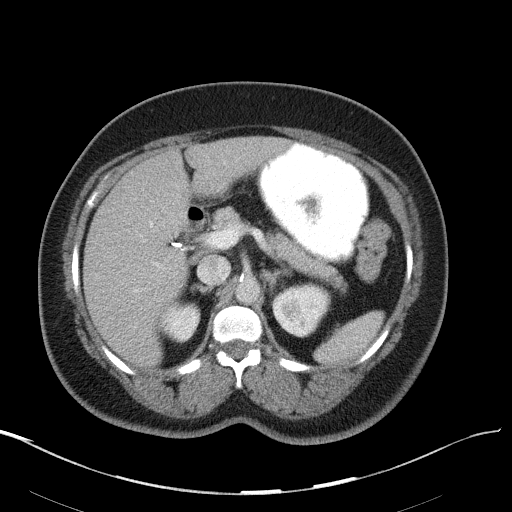
[im 68/87  soft-tissue]
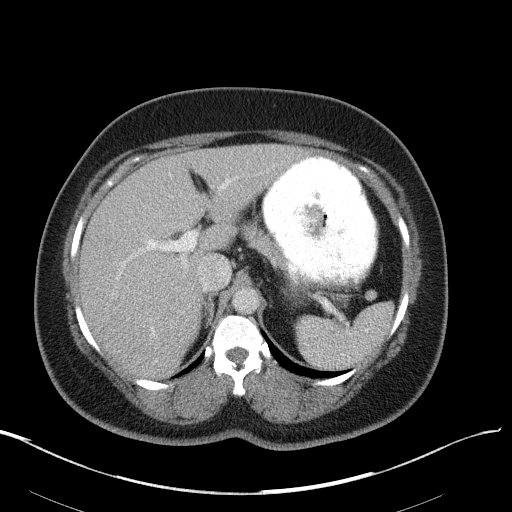
[im 73/87  soft-tissue]
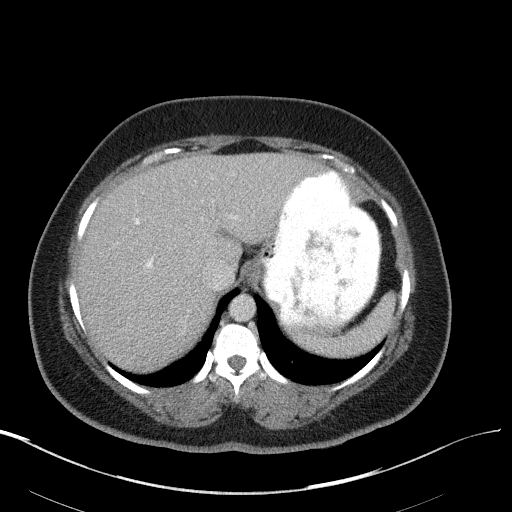
[im 82/87  soft-tissue]
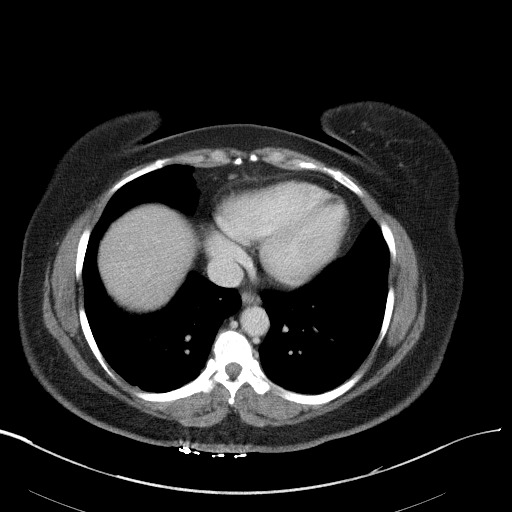

[Series 5: abd/pelvis 3.0 coronal · coronal · 0.78mm/px · 3 of 101 slices shown]
[im 34/101  soft-tissue]
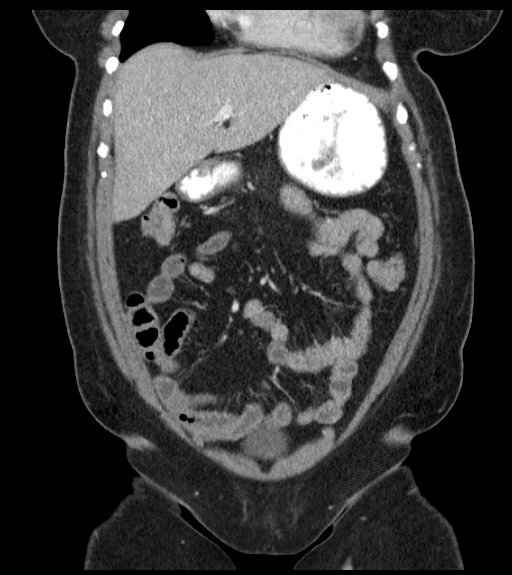
[im 45/101  soft-tissue]
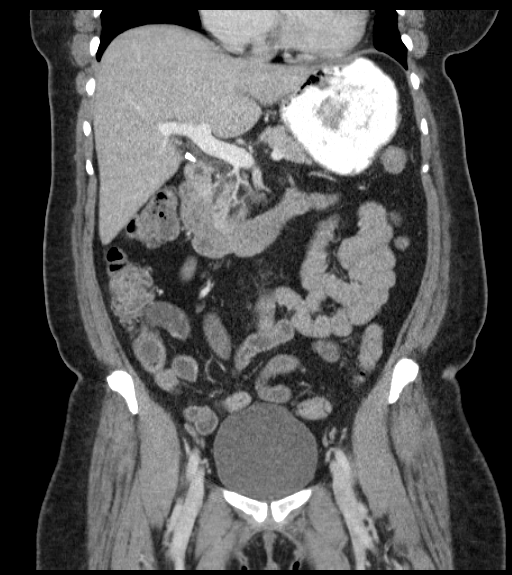
[im 56/101  soft-tissue]
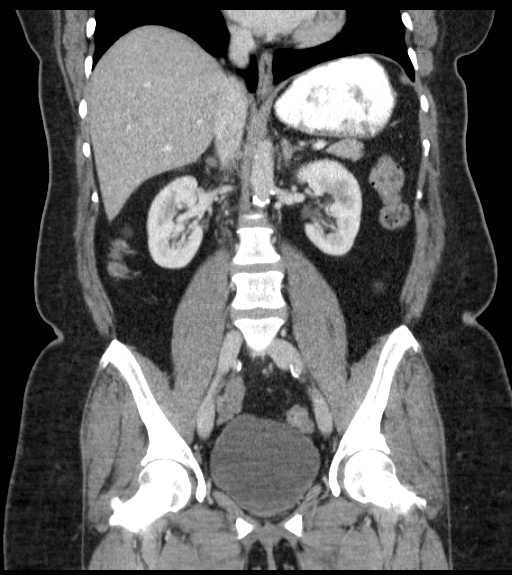

[16 of 46 positions shown; findings below may reference images not displayed]

FINDINGS: Minimal bibasilar atelectasis is noted.

The liver and spleen are unremarkable in appearance.  The patient
is status post cholecystectomy, with clips noted at the gallbladder
fossa.  The pancreas and adrenal glands are unremarkable.

[The kidneys are unremarkable in appearance.  There is no evidence
of hydronephrosis.  No renal or ureteral stones are seen.  No
perinephric stranding is appreciated.]

No free fluid is identified.  The small bowel is unremarkable in
appearance.  The stomach is within normal limits.  No acute
vascular abnormalities are seen.  Scattered calcification is noted
along the abdominal aorta and its branches.

The appendix is normal in caliber, without evidence for
appendicitis.  A single diverticulum is noted along the distal
descending colon; the colon is unremarkable in appearance.

The bladder is mildly distended and grossly unremarkable in
appearance.  The patient is status post hysterectomy.  The ovaries
are relatively symmetric; no suspicious adnexal mass is seen.  No
inguinal lymphadenopathy is seen.

No acute osseous abnormalities are identified.
IMPRESSION: 1.  No acute abnormalities seen within the abdomen or pelvis.
2.  Scattered calcification along the abdominal aorta and its
branches.

## 2014-04-11 ENCOUNTER — Encounter (HOSPITAL_BASED_OUTPATIENT_CLINIC_OR_DEPARTMENT_OTHER): Payer: Self-pay | Admitting: Emergency Medicine

## 2014-04-11 ENCOUNTER — Emergency Department (HOSPITAL_BASED_OUTPATIENT_CLINIC_OR_DEPARTMENT_OTHER)
Admission: EM | Admit: 2014-04-11 | Discharge: 2014-04-11 | Disposition: A | Discharge status: Home / Self Care | Payer: Medicare Other | Attending: Emergency Medicine | Admitting: Emergency Medicine

## 2014-04-11 DIAGNOSIS — E079 Disorder of thyroid, unspecified: Secondary | ICD-10-CM | POA: Insufficient documentation

## 2014-04-11 DIAGNOSIS — I10 Essential (primary) hypertension: Secondary | ICD-10-CM | POA: Insufficient documentation

## 2014-04-11 DIAGNOSIS — T4995XA Adverse effect of unspecified topical agent, initial encounter: Secondary | ICD-10-CM | POA: Diagnosis not present

## 2014-04-11 DIAGNOSIS — Z8673 Personal history of transient ischemic attack (TIA), and cerebral infarction without residual deficits: Secondary | ICD-10-CM | POA: Diagnosis not present

## 2014-04-11 DIAGNOSIS — Z79899 Other long term (current) drug therapy: Secondary | ICD-10-CM | POA: Insufficient documentation

## 2014-04-11 DIAGNOSIS — L299 Pruritus, unspecified: Secondary | ICD-10-CM | POA: Insufficient documentation

## 2014-04-11 DIAGNOSIS — K219 Gastro-esophageal reflux disease without esophagitis: Secondary | ICD-10-CM | POA: Diagnosis not present

## 2014-04-11 DIAGNOSIS — J029 Acute pharyngitis, unspecified: Secondary | ICD-10-CM | POA: Insufficient documentation

## 2014-04-11 DIAGNOSIS — Z792 Long term (current) use of antibiotics: Secondary | ICD-10-CM | POA: Insufficient documentation

## 2014-04-11 DIAGNOSIS — R51 Headache: Secondary | ICD-10-CM | POA: Insufficient documentation

## 2014-04-11 DIAGNOSIS — E7800 Pure hypercholesterolemia, unspecified: Secondary | ICD-10-CM | POA: Insufficient documentation

## 2014-04-11 DIAGNOSIS — Z8739 Personal history of other diseases of the musculoskeletal system and connective tissue: Secondary | ICD-10-CM | POA: Diagnosis not present

## 2014-04-11 DIAGNOSIS — T7840XA Allergy, unspecified, initial encounter: Secondary | ICD-10-CM

## 2014-04-11 DIAGNOSIS — IMO0002 Reserved for concepts with insufficient information to code with codable children: Secondary | ICD-10-CM | POA: Diagnosis not present

## 2014-04-11 MED ORDER — PREDNISONE 50 MG PO TABS: ORAL_TABLET | ORAL | Status: DC

## 2014-04-11 MED ORDER — DIPHENHYDRAMINE HCL 25 MG PO TABS: 25.0000 mg | ORAL_TABLET | Freq: Four times a day (QID) | ORAL | Status: DC

## 2014-04-11 MED ORDER — FAMOTIDINE 20 MG PO TABS: 20.0000 mg | ORAL_TABLET | Freq: Two times a day (BID) | ORAL | Status: DC

## 2014-04-11 MED ORDER — PREDNISONE 50 MG PO TABS
60.0000 mg | ORAL_TABLET | Freq: Once | ORAL | Status: AC
  Administered 2014-04-11: 60 mg via ORAL
  Filled 2014-04-11 (×2): qty 1

## 2014-04-11 MED ORDER — FAMOTIDINE 20 MG PO TABS
20.0000 mg | ORAL_TABLET | Freq: Once | ORAL | Status: AC
  Administered 2014-04-11: 20 mg via ORAL
  Filled 2014-04-11: qty 1

## 2014-04-11 MED ORDER — GI COCKTAIL ~~LOC~~
30.0000 mL | Freq: Once | ORAL | Status: AC
  Administered 2014-04-11: 30 mL via ORAL
  Filled 2014-04-11: qty 30

## 2014-04-11 MED ORDER — DIPHENHYDRAMINE HCL 25 MG PO CAPS: 25.0000 mg | ORAL_CAPSULE | Freq: Once | ORAL | Status: DC

## 2014-04-11 NOTE — ED Notes (Signed)
MD at bedside. 

## 2014-04-11 NOTE — Discharge Instructions (Signed)
Allergies Take the steroids antihistamines as prescribed. Return to ED if you develop difficulty breathing, difficulty swallowing, chest pain, shortness of breath or concerns. Allergies may happen from anything your body is sensitive to. This may be food, medicines, pollens, chemicals, and nearly anything around you in everyday life that produces allergens. An allergen is anything that causes an allergy producing substance. Heredity is often a factor in causing these problems. This means you may have some of the same allergies as your parents. Food allergies happen in all age groups. Food allergies are some of the most severe and life threatening. Some common food allergies are cow's milk, seafood, eggs, nuts, wheat, and soybeans. SYMPTOMS   Swelling around the mouth.  An itchy red rash or hives.  Vomiting or diarrhea.  Difficulty breathing. SEVERE ALLERGIC REACTIONS ARE LIFE-THREATENING. This reaction is called anaphylaxis. It can cause the mouth and throat to swell and cause difficulty with breathing and swallowing. In severe reactions only a trace amount of food (for example, peanut oil in a salad) may cause death within seconds. Seasonal allergies occur in all age groups. These are seasonal because they usually occur during the same season every year. They may be a reaction to molds, grass pollens, or tree pollens. Other causes of problems are house dust mite allergens, pet dander, and mold spores. The symptoms often consist of nasal congestion, a runny itchy nose associated with sneezing, and tearing itchy eyes. There is often an associated itching of the mouth and ears. The problems happen when you come in contact with pollens and other allergens. Allergens are the particles in the air that the body reacts to with an allergic reaction. This causes you to release allergic antibodies. Through a chain of events, these eventually cause you to release histamine into the blood stream. Although it is  meant to be protective to the body, it is this release that causes your discomfort. This is why you were given anti-histamines to feel better. If you are unable to pinpoint the offending allergen, it may be determined by skin or blood testing. Allergies cannot be cured but can be controlled with medicine. Hay fever is a collection of all or some of the seasonal allergy problems. It may often be treated with simple over-the-counter medicine such as diphenhydramine. Take medicine as directed. Do not drink alcohol or drive while taking this medicine. Check with your caregiver or package insert for child dosages. If these medicines are not effective, there are many new medicines your caregiver can prescribe. Stronger medicine such as nasal spray, eye drops, and corticosteroids may be used if the first things you try do not work well. Other treatments such as immunotherapy or desensitizing injections can be used if all else fails. Follow up with your caregiver if problems continue. These seasonal allergies are usually not life threatening. They are generally more of a nuisance that can often be handled using medicine. HOME CARE INSTRUCTIONS   If unsure what causes a reaction, keep a diary of foods eaten and symptoms that follow. Avoid foods that cause reactions.  If hives or rash are present:  Take medicine as directed.  You may use an over-the-counter antihistamine (diphenhydramine) for hives and itching as needed.  Apply cold compresses (cloths) to the skin or take baths in cool water. Avoid hot baths or showers. Heat will make a rash and itching worse.  If you are severely allergic:  Following a treatment for a severe reaction, hospitalization is often required for closer follow-up.  Wear a medic-alert bracelet or necklace stating the allergy. °¨ You and your family must learn how to give adrenaline or use an anaphylaxis kit. °¨ If you have had a severe reaction, always carry your anaphylaxis kit  or EpiPen® with you. Use this medicine as directed by your caregiver if a severe reaction is occurring. Failure to do so could have a fatal outcome. °SEEK MEDICAL CARE IF: °· You suspect a food allergy. Symptoms generally happen within 30 minutes of eating a food. °· Your symptoms have not gone away within 2 days or are getting worse. °· You develop new symptoms. °· You want to retest yourself or your child with a food or drink you think causes an allergic reaction. Never do this if an anaphylactic reaction to that food or drink has happened before. Only do this under the care of a caregiver. °SEEK IMMEDIATE MEDICAL CARE IF:  °· You have difficulty breathing, are wheezing, or have a tight feeling in your chest or throat. °· You have a swollen mouth, or you have hives, swelling, or itching all over your body. °· You have had a severe reaction that has responded to your anaphylaxis kit or an EpiPen®. These reactions may return when the medicine has worn off. These reactions should be considered life threatening. °MAKE SURE YOU:  °· Understand these instructions. °· Will watch your condition. °· Will get help right away if you are not doing well or get worse. °Document Released: 10/06/2002 Document Revised: 11/07/2012 Document Reviewed: 03/12/2008 °[employer]® Patient Information ©2015 [employer]. This information is not intended to replace advice given to you by your health care provider. Make sure you discuss any questions you have with your health care provider. ° °

## 2014-04-11 NOTE — ED Notes (Signed)
C/o itching started last pm. Took benadryl today. Also c/o itching in throat. No resp distress noted.

## 2014-04-11 NOTE — ED Provider Notes (Signed)
CSN: [id number]     Arrival date & time 04/11/14  1746 History  This chart was scribed for Tiffany Octave, MD by Luisa Dago, ED Scribe. This patient was seen in room MH03/MH03 and the patient's care was started at 6:10 PM.    Chief Complaint  Patient presents with  . Allergic Reaction   The history is provided by the patient. No language interpreter was used.  HPI Comments: Tiffany Larsen is a 53 y.o. female who presents to the Emergency Department complaining of a possible allergic reaction that started last night. Pt states that she started itching all over so she told her spouse to go out and by her some Children Benadryl, because in the past it has worked faster to alleviate her symptoms. Pt took 75 mg of Children's Benadryl at 4PM PTA. She states that the itching did not subside but progressively got worse today. Pt is also complaining of associated headache, sore throat, and voice change (which has worsened in the last hour). She does endorse a change in detergent. Denies any new foods, soaps, or lotions. Currently, she states that the itching has subsided a little. Pt does have a hx of Thyroid disease, HTN, and stroke. The stroke caused her to have a limp, but after physical therapy it has resolved. She denies any fever, chills, nausea, emesis, hx of DM, anyone with similar symptoms at home, rash, facial swelling, or difficulty breathing.   Past Medical History  Diagnosis Date  . Thyroid disease   . Hypertension   . Stroke   . Back pain   . Hypercholesteremia   . Sleep apnea   . Arthritis   . GERD (gastroesophageal reflux disease)   . CVA (cerebral infarction)    Past Surgical History  Procedure Laterality Date  . Nasal sinus surgery    . Abdominal hysterectomy    . Carpal tunnel release    . Nasal sinus surgery    . Knee surgery     No family history on file. History  Substance Use Topics  . Smoking status: Never Smoker   . Smokeless tobacco: Never Used  . Alcohol Use:  No   OB History   Grav Para Term Preterm Abortions TAB SAB Ect Mult Living                 Review of Systems A complete 10 system review of systems was obtained and all systems are negative except as noted in the HPI and PMH.    Allergies  Review of patient's allergies indicates no known allergies.  Home Medications   Prior to Admission medications   Medication Sig Start Date End Date Taking? Authorizing Provider  albuterol (PROVENTIL,VENTOLIN) 90 MCG/ACT inhaler Inhale 2 puffs into the lungs every 6 (six) hours as needed. For shortness of breath    Historical Provider, MD  ALPRAZolam (XANAX) 0.25 MG tablet Take 0.25 mg by mouth 3 (three) times daily as needed for anxiety.    Historical Provider, MD  amoxicillin (AMOXIL) 500 MG capsule Take 500 mg by mouth 3 (three) times daily.    Historical Provider, MD  atenolol-chlorthalidone (TENORETIC) 50-25 MG per tablet Take 1 tablet by mouth daily.      Historical Provider, MD  atorvastatin (LIPITOR) 80 MG tablet Take 80 mg by mouth daily.    Historical Provider, MD  butalbital-acetaminophen-caffeine (FIORICET, ESGIC) 50-325-40 MG per tablet Take 1 tablet by mouth every 4 (four) hours as needed. For headache     Historical Provider,  MD  ciprofloxacin (CIPRO) 500 MG tablet Take 1 tablet (500 mg total) by mouth 2 (two) times daily. 02/22/13   Elson Areas, PA-C  colchicine 0.6 MG tablet Take 0.6 mg by mouth daily.    Historical Provider, MD  diphenhydrAMINE (BENADRYL) 25 MG tablet Take 50 mg by mouth every 6 (six) hours as needed. For allergies    Historical Provider, MD  diphenhydrAMINE (BENADRYL) 25 MG tablet Take 1 tablet (25 mg total) by mouth every 6 (six) hours. 04/11/14   Tiffany Octave, MD  diphenoxylate-atropine (LOMOTIL) 2.5-0.025 MG per tablet Take 1 tablet by mouth 4 (four) times daily as needed for diarrhea or loose stools. 02/22/13   Elson Areas, PA-C  esomeprazole (NEXIUM) 40 MG capsule Take 40 mg by mouth daily before breakfast.       Historical Provider, MD  famotidine (PEPCID) 20 MG tablet Take 1 tablet (20 mg total) by mouth 2 (two) times daily. 04/11/14   Tiffany Octave, MD  guaiFENesin-codeine [facility] 100-10 MG/5ML syrup Take 5 mLs by mouth 3 (three) times daily as needed for cough. 07/29/13   Hope Orlene Och, NP  Hydrocodone-Ibuprofen (VICOPROFEN PO) Take by mouth every 8 (eight) hours as needed.    Historical Provider, MD  levothyroxine (SYNTHROID, LEVOTHROID) 100 MCG tablet Take 100 mcg by mouth daily.    Historical Provider, MD  lisinopril (PRINIVIL,ZESTRIL) 10 MG tablet Take 10 mg by mouth daily.      Historical Provider, MD  Menthol, Topical Analgesic, (BIOFREEZE) 4 % GEL Apply 1 application topically 2 (two) times daily as needed. For knee pain    Historical Provider, MD  ondansetron (ZOFRAN ODT) 4 MG disintegrating tablet Take 1 tablet (4 mg total) by mouth every 8 (eight) hours as needed for nausea. 02/22/13   Elson Areas, PA-C  potassium chloride SA (K-DUR,KLOR-CON) 20 MEQ tablet Take 20 mEq by mouth daily.      Historical Provider, MD  pravastatin (PRAVACHOL) 80 MG tablet Take 80 mg by mouth daily.    Historical Provider, MD  predniSONE (DELTASONE) 10 MG tablet Take 2 tablets (20 mg total) by mouth 2 (two) times daily with a meal. 07/29/13   Hope Orlene Och, NP  predniSONE (DELTASONE) 50 MG tablet 1 tablet PO daily 04/11/14   Tiffany Octave, MD  traMADol (ULTRAM) 50 MG tablet Take 1 tablet (50 mg total) by mouth every 6 (six) hours as needed for pain. 07/22/12   April K Palumbo-Rasch, MD   Triage Vitals:BP 155/74  Pulse 81  Temp(Src) 98.2 F (36.8 C) (Oral)  Resp 20  Ht  (1.575 m)  Wt 188 lb (85.276 kg)  BMI 34.38 kg/m2  SpO2 100%  Physical Exam  Nursing note and vitals reviewed. Constitutional: She is oriented to person, place, and time. She appears well-developed and well-nourished. No distress.  HENT:  Head: Normocephalic and atraumatic.  Mouth/Throat: Oropharynx is clear and moist. No  oropharyngeal exudate.  No asymmetry. No lip or tongue swelling.   Eyes: Conjunctivae and EOM are normal. Pupils are equal, round, and reactive to light.  Neck: Normal range of motion. Neck supple.  No meningismus.  Cardiovascular: Normal rate, regular rhythm, normal heart sounds and intact distal pulses.   No murmur heard. Pulmonary/Chest: Effort normal and breath sounds normal. No respiratory distress. She has no wheezes. She has no rales.  Abdominal: Soft. There is no tenderness. There is no rebound and no guarding.  Musculoskeletal: Normal range of motion. She exhibits no edema  and no tenderness.  Neurological: She is alert and oriented to person, place, and time. No cranial nerve deficit. She exhibits normal muscle tone. Coordination normal.  No ataxia on finger to nose bilaterally. No pronator drift. 5/5 strength throughout. CN 2-12 intact. Negative Romberg. Equal grip strength. Sensation intact. Gait is normal. She is in no distress. Clearing throat frequently.  Skin: Skin is warm. No rash noted.  Psychiatric: She has a normal mood and affect. Her behavior is normal.    ED Course  Procedures   DIAGNOSTIC STUDIES: Oxygen Saturation is 100% on RA, normal by my interpretation.    COORDINATION OF CARE: 6:19 PM- Pt advised of plan for treatment and pt agrees.  EKG Interpretation Date/Time:  Wednesday April 11 2014 19:44:42 EDT Ventricular Rate:  73 PR Interval:  150 QRS Duration: 92 QT Interval:  444 QTC Calculation: 489 R Axis:   0 Text Interpretation:  Normal sinus rhythm Nonspecific ST and T wave  abnormality Prolonged QT Abnormal ECG No significant change was found  Confirmed by Manus Gunning  MD, Jadia Capers ([id number]) on 04/11/2014 7:45:27 PM    8:19 PM- Pt was rechecked. She states that she is doing well. The itching has resolved. She denies any chest pain. Pt will be discharged with steroids and anti-histamines.  MDM   Final diagnoses:  Allergic reaction, initial encounter    Patient with diffuse itching since last night. Also describes some itching and discomfort in her throat. No difficulty breathing or swallowing. No wheezing. No chest pain or shortness of breath. States she used a Risk manager. No rash.  Airway is intact. Lungs are clear. No wheezing. Patient will be given steroids, antihistamines.  Symptoms improved with treatment. No difficulty breathing or swallowing. No chest pain or shortness of breath. Advised to discontinue use of laundry detergent. Continue steroids and antihistamines. Followup with PCP in the ED with worsening symptoms  BP 155/74  Pulse 81  Temp(Src) 98.2 F (36.8 C) (Oral)  Resp 20  Ht  (1.575 m)  Wt 188 lb (85.276 kg)  BMI 34.38 kg/m2  SpO2 100%  I personally performed the services described in this documentation, which was scribed in my presence. The recorded information has been reviewed and is accurate.    Tiffany Octave, MD 04/11/14 [phone]

## 2016-07-29 DIAGNOSIS — I10 Essential (primary) hypertension: Secondary | ICD-10-CM | POA: Diagnosis not present

## 2016-07-29 DIAGNOSIS — J040 Acute laryngitis: Secondary | ICD-10-CM | POA: Diagnosis not present

## 2016-07-29 DIAGNOSIS — J209 Acute bronchitis, unspecified: Secondary | ICD-10-CM | POA: Diagnosis not present

## 2016-09-03 DIAGNOSIS — K219 Gastro-esophageal reflux disease without esophagitis: Secondary | ICD-10-CM | POA: Diagnosis not present

## 2016-09-03 DIAGNOSIS — E039 Hypothyroidism, unspecified: Secondary | ICD-10-CM | POA: Diagnosis not present

## 2016-09-03 DIAGNOSIS — Z5181 Encounter for therapeutic drug level monitoring: Secondary | ICD-10-CM | POA: Diagnosis not present

## 2016-09-03 DIAGNOSIS — E559 Vitamin D deficiency, unspecified: Secondary | ICD-10-CM | POA: Diagnosis not present

## 2016-09-03 DIAGNOSIS — E785 Hyperlipidemia, unspecified: Secondary | ICD-10-CM | POA: Diagnosis not present

## 2016-09-03 DIAGNOSIS — E119 Type 2 diabetes mellitus without complications: Secondary | ICD-10-CM | POA: Diagnosis not present

## 2016-09-03 DIAGNOSIS — F419 Anxiety disorder, unspecified: Secondary | ICD-10-CM | POA: Diagnosis not present

## 2016-09-03 DIAGNOSIS — G43909 Migraine, unspecified, not intractable, without status migrainosus: Secondary | ICD-10-CM | POA: Diagnosis not present

## 2016-09-03 DIAGNOSIS — I10 Essential (primary) hypertension: Secondary | ICD-10-CM | POA: Diagnosis not present

## 2016-09-04 DIAGNOSIS — Z1231 Encounter for screening mammogram for malignant neoplasm of breast: Secondary | ICD-10-CM | POA: Diagnosis not present

## 2016-09-11 DIAGNOSIS — M25561 Pain in right knee: Secondary | ICD-10-CM | POA: Diagnosis not present

## 2016-09-11 DIAGNOSIS — M25461 Effusion, right knee: Secondary | ICD-10-CM | POA: Diagnosis not present

## 2016-09-11 DIAGNOSIS — M1711 Unilateral primary osteoarthritis, right knee: Secondary | ICD-10-CM | POA: Diagnosis not present

## 2016-10-22 DIAGNOSIS — E559 Vitamin D deficiency, unspecified: Secondary | ICD-10-CM | POA: Diagnosis not present

## 2016-10-22 DIAGNOSIS — R05 Cough: Secondary | ICD-10-CM | POA: Diagnosis not present

## 2016-10-22 DIAGNOSIS — G43909 Migraine, unspecified, not intractable, without status migrainosus: Secondary | ICD-10-CM | POA: Diagnosis not present

## 2016-10-22 DIAGNOSIS — I10 Essential (primary) hypertension: Secondary | ICD-10-CM | POA: Diagnosis not present

## 2016-10-22 DIAGNOSIS — R062 Wheezing: Secondary | ICD-10-CM | POA: Diagnosis not present

## 2016-10-22 DIAGNOSIS — K219 Gastro-esophageal reflux disease without esophagitis: Secondary | ICD-10-CM | POA: Diagnosis not present

## 2016-10-22 DIAGNOSIS — J029 Acute pharyngitis, unspecified: Secondary | ICD-10-CM | POA: Diagnosis not present

## 2016-10-22 DIAGNOSIS — F419 Anxiety disorder, unspecified: Secondary | ICD-10-CM | POA: Diagnosis not present

## 2016-10-22 DIAGNOSIS — E119 Type 2 diabetes mellitus without complications: Secondary | ICD-10-CM | POA: Diagnosis not present

## 2016-10-22 DIAGNOSIS — E039 Hypothyroidism, unspecified: Secondary | ICD-10-CM | POA: Diagnosis not present

## 2016-10-22 DIAGNOSIS — E785 Hyperlipidemia, unspecified: Secondary | ICD-10-CM | POA: Diagnosis not present

## 2016-12-03 DIAGNOSIS — E559 Vitamin D deficiency, unspecified: Secondary | ICD-10-CM | POA: Diagnosis not present

## 2016-12-03 DIAGNOSIS — G43909 Migraine, unspecified, not intractable, without status migrainosus: Secondary | ICD-10-CM | POA: Diagnosis not present

## 2016-12-03 DIAGNOSIS — R635 Abnormal weight gain: Secondary | ICD-10-CM | POA: Diagnosis not present

## 2016-12-03 DIAGNOSIS — F419 Anxiety disorder, unspecified: Secondary | ICD-10-CM | POA: Diagnosis not present

## 2016-12-03 DIAGNOSIS — E785 Hyperlipidemia, unspecified: Secondary | ICD-10-CM | POA: Diagnosis not present

## 2016-12-03 DIAGNOSIS — E039 Hypothyroidism, unspecified: Secondary | ICD-10-CM | POA: Diagnosis not present

## 2016-12-03 DIAGNOSIS — Z0000 Encounter for general adult medical examination without abnormal findings: Secondary | ICD-10-CM | POA: Diagnosis not present

## 2016-12-03 DIAGNOSIS — M1711 Unilateral primary osteoarthritis, right knee: Secondary | ICD-10-CM | POA: Diagnosis not present

## 2016-12-03 DIAGNOSIS — K219 Gastro-esophageal reflux disease without esophagitis: Secondary | ICD-10-CM | POA: Diagnosis not present

## 2016-12-03 DIAGNOSIS — E119 Type 2 diabetes mellitus without complications: Secondary | ICD-10-CM | POA: Diagnosis not present

## 2016-12-03 DIAGNOSIS — I10 Essential (primary) hypertension: Secondary | ICD-10-CM | POA: Diagnosis not present

## 2016-12-09 DIAGNOSIS — M25661 Stiffness of right knee, not elsewhere classified: Secondary | ICD-10-CM | POA: Diagnosis not present

## 2016-12-09 DIAGNOSIS — M1711 Unilateral primary osteoarthritis, right knee: Secondary | ICD-10-CM | POA: Diagnosis not present

## 2016-12-14 DIAGNOSIS — M1711 Unilateral primary osteoarthritis, right knee: Secondary | ICD-10-CM | POA: Diagnosis not present

## 2016-12-14 DIAGNOSIS — M25661 Stiffness of right knee, not elsewhere classified: Secondary | ICD-10-CM | POA: Diagnosis not present

## 2016-12-18 DIAGNOSIS — M25661 Stiffness of right knee, not elsewhere classified: Secondary | ICD-10-CM | POA: Diagnosis not present

## 2016-12-18 DIAGNOSIS — M1711 Unilateral primary osteoarthritis, right knee: Secondary | ICD-10-CM | POA: Diagnosis not present

## 2016-12-22 DIAGNOSIS — M25661 Stiffness of right knee, not elsewhere classified: Secondary | ICD-10-CM | POA: Diagnosis not present

## 2016-12-22 DIAGNOSIS — M1711 Unilateral primary osteoarthritis, right knee: Secondary | ICD-10-CM | POA: Diagnosis not present

## 2017-01-07 DIAGNOSIS — E559 Vitamin D deficiency, unspecified: Secondary | ICD-10-CM | POA: Diagnosis not present

## 2017-01-07 DIAGNOSIS — E039 Hypothyroidism, unspecified: Secondary | ICD-10-CM | POA: Diagnosis not present

## 2017-01-07 DIAGNOSIS — E785 Hyperlipidemia, unspecified: Secondary | ICD-10-CM | POA: Diagnosis not present

## 2017-01-07 DIAGNOSIS — E119 Type 2 diabetes mellitus without complications: Secondary | ICD-10-CM | POA: Diagnosis not present

## 2017-01-07 DIAGNOSIS — J069 Acute upper respiratory infection, unspecified: Secondary | ICD-10-CM | POA: Diagnosis not present

## 2017-01-07 DIAGNOSIS — F419 Anxiety disorder, unspecified: Secondary | ICD-10-CM | POA: Diagnosis not present

## 2017-01-07 DIAGNOSIS — K219 Gastro-esophageal reflux disease without esophagitis: Secondary | ICD-10-CM | POA: Diagnosis not present

## 2017-01-07 DIAGNOSIS — G43909 Migraine, unspecified, not intractable, without status migrainosus: Secondary | ICD-10-CM | POA: Diagnosis not present

## 2017-01-07 DIAGNOSIS — R635 Abnormal weight gain: Secondary | ICD-10-CM | POA: Diagnosis not present

## 2017-05-06 DIAGNOSIS — E119 Type 2 diabetes mellitus without complications: Secondary | ICD-10-CM | POA: Diagnosis not present

## 2017-05-06 DIAGNOSIS — E559 Vitamin D deficiency, unspecified: Secondary | ICD-10-CM | POA: Diagnosis not present

## 2017-05-06 DIAGNOSIS — F419 Anxiety disorder, unspecified: Secondary | ICD-10-CM | POA: Diagnosis not present

## 2017-05-06 DIAGNOSIS — K219 Gastro-esophageal reflux disease without esophagitis: Secondary | ICD-10-CM | POA: Diagnosis not present

## 2017-05-06 DIAGNOSIS — E039 Hypothyroidism, unspecified: Secondary | ICD-10-CM | POA: Diagnosis not present

## 2017-05-06 DIAGNOSIS — G43909 Migraine, unspecified, not intractable, without status migrainosus: Secondary | ICD-10-CM | POA: Diagnosis not present

## 2017-05-06 DIAGNOSIS — E785 Hyperlipidemia, unspecified: Secondary | ICD-10-CM | POA: Diagnosis not present

## 2017-05-06 DIAGNOSIS — R635 Abnormal weight gain: Secondary | ICD-10-CM | POA: Diagnosis not present

## 2017-05-06 DIAGNOSIS — Z23 Encounter for immunization: Secondary | ICD-10-CM | POA: Diagnosis not present

## 2017-06-11 DIAGNOSIS — R21 Rash and other nonspecific skin eruption: Secondary | ICD-10-CM | POA: Diagnosis not present

## 2017-06-11 DIAGNOSIS — G43909 Migraine, unspecified, not intractable, without status migrainosus: Secondary | ICD-10-CM | POA: Diagnosis not present

## 2017-06-11 DIAGNOSIS — E559 Vitamin D deficiency, unspecified: Secondary | ICD-10-CM | POA: Diagnosis not present

## 2017-06-11 DIAGNOSIS — E785 Hyperlipidemia, unspecified: Secondary | ICD-10-CM | POA: Diagnosis not present

## 2017-06-11 DIAGNOSIS — E119 Type 2 diabetes mellitus without complications: Secondary | ICD-10-CM | POA: Diagnosis not present

## 2017-06-11 DIAGNOSIS — E039 Hypothyroidism, unspecified: Secondary | ICD-10-CM | POA: Diagnosis not present

## 2017-06-11 DIAGNOSIS — F419 Anxiety disorder, unspecified: Secondary | ICD-10-CM | POA: Diagnosis not present

## 2017-06-11 DIAGNOSIS — K219 Gastro-esophageal reflux disease without esophagitis: Secondary | ICD-10-CM | POA: Diagnosis not present

## 2017-06-15 DIAGNOSIS — Z0000 Encounter for general adult medical examination without abnormal findings: Secondary | ICD-10-CM | POA: Diagnosis not present

## 2017-06-15 DIAGNOSIS — F419 Anxiety disorder, unspecified: Secondary | ICD-10-CM | POA: Diagnosis not present

## 2017-06-15 DIAGNOSIS — R350 Frequency of micturition: Secondary | ICD-10-CM | POA: Diagnosis not present

## 2017-06-15 DIAGNOSIS — K219 Gastro-esophageal reflux disease without esophagitis: Secondary | ICD-10-CM | POA: Diagnosis not present

## 2017-06-15 DIAGNOSIS — E785 Hyperlipidemia, unspecified: Secondary | ICD-10-CM | POA: Diagnosis not present

## 2017-06-15 DIAGNOSIS — E119 Type 2 diabetes mellitus without complications: Secondary | ICD-10-CM | POA: Diagnosis not present

## 2017-06-15 DIAGNOSIS — E559 Vitamin D deficiency, unspecified: Secondary | ICD-10-CM | POA: Diagnosis not present

## 2017-06-15 DIAGNOSIS — E039 Hypothyroidism, unspecified: Secondary | ICD-10-CM | POA: Diagnosis not present

## 2017-06-15 DIAGNOSIS — J302 Other seasonal allergic rhinitis: Secondary | ICD-10-CM | POA: Diagnosis not present

## 2017-06-15 DIAGNOSIS — G43909 Migraine, unspecified, not intractable, without status migrainosus: Secondary | ICD-10-CM | POA: Diagnosis not present

## 2017-06-15 DIAGNOSIS — R21 Rash and other nonspecific skin eruption: Secondary | ICD-10-CM | POA: Diagnosis not present

## 2017-06-26 DIAGNOSIS — M79605 Pain in left leg: Secondary | ICD-10-CM | POA: Diagnosis not present

## 2017-06-26 DIAGNOSIS — R21 Rash and other nonspecific skin eruption: Secondary | ICD-10-CM | POA: Diagnosis not present

## 2017-06-26 DIAGNOSIS — M7989 Other specified soft tissue disorders: Secondary | ICD-10-CM | POA: Diagnosis not present

## 2017-06-26 DIAGNOSIS — L309 Dermatitis, unspecified: Secondary | ICD-10-CM | POA: Diagnosis not present

## 2017-06-29 DIAGNOSIS — R21 Rash and other nonspecific skin eruption: Secondary | ICD-10-CM | POA: Diagnosis not present

## 2017-06-29 DIAGNOSIS — L508 Other urticaria: Secondary | ICD-10-CM | POA: Diagnosis not present

## 2017-07-09 DIAGNOSIS — L089 Local infection of the skin and subcutaneous tissue, unspecified: Secondary | ICD-10-CM | POA: Diagnosis not present

## 2017-07-13 DIAGNOSIS — K219 Gastro-esophageal reflux disease without esophagitis: Secondary | ICD-10-CM | POA: Diagnosis not present

## 2017-07-13 DIAGNOSIS — E559 Vitamin D deficiency, unspecified: Secondary | ICD-10-CM | POA: Diagnosis not present

## 2017-07-13 DIAGNOSIS — G43909 Migraine, unspecified, not intractable, without status migrainosus: Secondary | ICD-10-CM | POA: Diagnosis not present

## 2017-07-13 DIAGNOSIS — L089 Local infection of the skin and subcutaneous tissue, unspecified: Secondary | ICD-10-CM | POA: Diagnosis not present

## 2017-07-13 DIAGNOSIS — J302 Other seasonal allergic rhinitis: Secondary | ICD-10-CM | POA: Diagnosis not present

## 2017-07-13 DIAGNOSIS — E785 Hyperlipidemia, unspecified: Secondary | ICD-10-CM | POA: Diagnosis not present

## 2017-07-13 DIAGNOSIS — E119 Type 2 diabetes mellitus without complications: Secondary | ICD-10-CM | POA: Diagnosis not present

## 2017-07-13 DIAGNOSIS — E039 Hypothyroidism, unspecified: Secondary | ICD-10-CM | POA: Diagnosis not present

## 2017-07-13 DIAGNOSIS — F419 Anxiety disorder, unspecified: Secondary | ICD-10-CM | POA: Diagnosis not present

## 2017-08-04 ENCOUNTER — Encounter: Payer: Self-pay | Admitting: Allergy and Immunology

## 2017-08-04 ENCOUNTER — Ambulatory Visit (INDEPENDENT_AMBULATORY_CARE_PROVIDER_SITE_OTHER): Payer: Medicare Other | Admitting: Allergy and Immunology

## 2017-08-04 VITALS — BP 134/60 | HR 72 | Temp 98.4°F | Resp 20 | Ht 63.35 in | Wt 198.4 lb

## 2017-08-04 DIAGNOSIS — T7800XD Anaphylactic reaction due to unspecified food, subsequent encounter: Secondary | ICD-10-CM

## 2017-08-04 DIAGNOSIS — Z91018 Allergy to other foods: Secondary | ICD-10-CM | POA: Diagnosis not present

## 2017-08-04 DIAGNOSIS — J3089 Other allergic rhinitis: Secondary | ICD-10-CM

## 2017-08-04 DIAGNOSIS — L500 Allergic urticaria: Secondary | ICD-10-CM | POA: Diagnosis not present

## 2017-08-04 DIAGNOSIS — T7800XA Anaphylactic reaction due to unspecified food, initial encounter: Secondary | ICD-10-CM | POA: Insufficient documentation

## 2017-08-04 DIAGNOSIS — J4520 Mild intermittent asthma, uncomplicated: Secondary | ICD-10-CM | POA: Insufficient documentation

## 2017-08-04 HISTORY — DX: Mild intermittent asthma, uncomplicated: J45.20

## 2017-08-04 MED ORDER — LEVOCETIRIZINE DIHYDROCHLORIDE 5 MG PO TABS: 5.0000 mg | ORAL_TABLET | Freq: Every evening | ORAL | 5 refills | Status: DC

## 2017-08-04 MED ORDER — EPINEPHRINE 0.3 MG/0.3ML IJ SOAJ: INTRAMUSCULAR | 3 refills | Status: DC

## 2017-08-04 MED ORDER — MONTELUKAST SODIUM 10 MG PO TABS: 10.0000 mg | ORAL_TABLET | Freq: Every day | ORAL | 5 refills | Status: DC

## 2017-08-04 MED ORDER — RANITIDINE HCL 150 MG PO TABS: 150.0000 mg | ORAL_TABLET | Freq: Two times a day (BID) | ORAL | 5 refills | Status: DC

## 2017-08-04 NOTE — Assessment & Plan Note (Addendum)
Unclear etiology. Skin tests to select food allergens were negative today. NSAIDs and emotional stress commonly exacerbate urticaria but are not the underlying etiology in this case. Physical urticarias are negative by history (i.e. pressure-induced, temperature, vibration, solar, etc.). History and lesions are not consistent with urticaria pigmentosa so I am not suspicious for mastocytosis. There are no concomitant symptoms concerning for anaphylaxis or constitutional symptoms worrisome for an underlying malignancy. We will rule out other potential etiologies with labs. For symptom relief, patient is to take oral antihistamines as directed.  The following labs have been ordered: FCeRI antibody, TSH, anti-thyroglobulin antibody, thyroid peroxidase antibody, tryptase, CBC, CMP, ESR, ANA, and galactose-alpha-1,3-galactose IgE level.  The patient will be called with further recommendations after lab results have returned.  Instructions have been discussed and provided for H1/H2 receptor blockade with titration to find lowest effective dose.  A refill prescription has been provided for levocetirizine 5 mg daily if needed.  A prescription has been provided for ranitidine 150 mg twice daily if needed.  A prescription has been provided for montelukast 10 mg daily at bedtime.  Should there be a significant increase or change in symptoms, a journal is to be kept recording any foods eaten, beverages consumed, medications taken within a 6 hour period prior to the onset of symptoms, as well as record activities being performed, and environmental conditions. For any symptoms concerning for anaphylaxis, 911 is to be called immediately.

## 2017-08-04 NOTE — Patient Instructions (Signed)
Recurrent urticaria Unclear etiology. Skin tests to select food allergens were negative today. NSAIDs and emotional stress commonly exacerbate urticaria but are not the underlying etiology in this case. Physical urticarias are negative by history (i.e. pressure-induced, temperature, vibration, solar, etc.). History and lesions are not consistent with urticaria pigmentosa so I am not suspicious for mastocytosis. There are no concomitant symptoms concerning for anaphylaxis or constitutional symptoms worrisome for an underlying malignancy. We will rule out other potential etiologies with labs. For symptom relief, patient is to take oral antihistamines as directed.  The following labs have been ordered: FCeRI antibody, TSH, anti-thyroglobulin antibody, thyroid peroxidase antibody, tryptase, CBC, CMP, ESR, ANA, and galactose-alpha-1,3-galactose IgE level.  The patient will be called with further recommendations after lab results have returned.  Instructions have been discussed and provided for H1/H2 receptor blockade with titration to find lowest effective dose.  A refill prescription has been provided for levocetirizine 5 mg daily if needed.  A prescription has been provided for ranitidine 150 mg twice daily if needed.  A prescription has been provided for montelukast 10 mg daily at bedtime.  Should there be a significant increase or change in symptoms, a journal is to be kept recording any foods eaten, beverages consumed, medications taken within a 6 hour period prior to the onset of symptoms, as well as record activities being performed, and environmental conditions. For any symptoms concerning for anaphylaxis, 911 is to be called immediately.  Food allergy The patient's history suggests shellfish allergy and positive skin test results today confirm this diagnosis.  Meticulous avoidance of shellfish as discussed.  A prescription has been provided for epinephrine auto-injector 2 pack along with  instructions for proper administration.  A food allergy action plan has been provided and discussed.  Medic Alert identification is recommended.  Perennial allergic rhinitis with a nonallergic component  Aeroallergen avoidance measures have been discussed and provided in written form.  Levocetirizine and montelukast have been prescribed (as above).  A prescription has been provided for fluticasone nasal spray, one spray per nostril 1-2 times daily as needed. Proper nasal spray technique has been discussed and demonstrated.  Nasal saline spray (i.e., Simply Saline) or nasal saline lavage (i.e., NeilMed) is recommended as needed and prior to medicated nasal sprays.   When lab results have returned the patient will be called with further recommendations and follow up instructions.  Urticaria (Hives)  . Levocetirizine (Xyzal) 5 mg twice a day and ranitidine (Zantac) 150 mg twice a day. If no symptoms for 7-14 days then decrease to. . Levocetirizine (Xyzal) 5 mg twice a day and ranitidine (Zantac) 150 mg once a day.  If no symptoms for 7-14 days then decrease to. . Levocetirizine (Xyzal) 5 mg twice a day.  If no symptoms for 7-14 days then decrease to. . Levocetirizine (Xyzal) 5 mg once a day.  May use Benadryl (diphenhydramine) as needed for breakthrough symptoms       If symptoms return, then step up dosage  Control of [place] dust mites play a major role in allergic asthma and rhinitis.  They occur in environments with high humidity wherever human skin, the food for dust mites is found. High levels have been detected in dust obtained from mattresses, pillows, carpets, upholstered furniture, bed covers, clothes and soft toys.  The principal allergen of the house dust mite is found in its feces.  A gram of dust may contain 1,000 mites and 250,000 fecal particles.  Mite antigen is  easily measured in the air during house cleaning activities.    1. Encase  mattresses, including the box spring, and pillow, in an air tight cover.  Seal the zipper end of the encased mattresses with wide adhesive tape. 2. Wash the bedding in water of 130 degrees Farenheit weekly.  Avoid cotton comforters/quilts and flannel bedding: the most ideal bed covering is the dacron comforter. 3. Remove all upholstered furniture from the bedroom. 4. Remove carpets, carpet padding, rugs, and non-washable window drapes from the bedroom.  Wash drapes weekly or use plastic window coverings. 5. Remove all non-washable stuffed toys from the bedroom.  Wash stuffed toys weekly. 6. Have the room cleaned frequently with a vacuum cleaner and a damp dust-mop.  The patient should not be in a room which is being cleaned and should wait 1 hour after cleaning before going into the room. 7. Close and seal all heating outlets in the bedroom.  Otherwise, the room will become filled with dust-laden air.  An electric heater can be used to heat the room. 8. Reduce indoor humidity to less than 50%.  Do not use a humidifier.

## 2017-08-04 NOTE — Assessment & Plan Note (Signed)
   Aeroallergen avoidance measures have been discussed and provided in written form.  Levocetirizine and montelukast have been prescribed (as above).  A prescription has been provided for fluticasone nasal spray, one spray per nostril 1-2 times daily as needed. Proper nasal spray technique has been discussed and demonstrated.  Nasal saline spray (i.e., Simply Saline) or nasal saline lavage (i.e., NeilMed) is recommended as needed and prior to medicated nasal sprays. 

## 2017-08-04 NOTE — Progress Notes (Signed)
New Patient Note  RE: Tiffany Larsen MRN: [id number] DOB: 09/26/1960 Date of Office Visit: 08/04/2017  Referring provider: Benito Mccreedy, MD Primary care provider: Benito Mccreedy, MD  Chief Complaint: Urticaria; Allergic Reaction; and Nasal Congestion   History of present illness: Tiffany Larsen is a 57 y.o. female seen today in consultation requested by Benito Mccreedy, MD. She reports that in the late 1990s she consumed shrimp and "immediately broke out in hives all over" taken from the restaurant straight to the emergency department treated with antihistamines and corticosteroid injection.  He did not experience concomitant angioedema, cardiopulmonary symptoms, or GI symptoms.  She has avoided shrimp since that time.  She currently does not have an up-to-date with epinephrine autoinjector. She reports that in 2010 developed hives lasted for approximately 2 weeks and required 2 rounds of steroids as well as daily antihistamines for symptom relief.  Individual hives were described as red, raised, and pruritic and resolved without residual pigmentation or bruising.  In November 2018, she developed hives on her face.  She took diphenhydramine and the symptoms seemed to resolve, however the next day larger hives developed on her arms.  Therefore, she went to the emergency department for evaluation and treatment.  Her dermatologist performed a biopsy which was consistent with urticaria.  She was started on loratadine 10 mg 3 times daily.  Since that time, she has had recurrent episodes of of hives.  The hives are red, raised, and pruritic, and occur almost daily.  She was recently started on levocetirizine 5 mg daily with moderate relief.  No specific medication, food, skin care product, detergent, soap, or other environmental triggers have been identified.  However, she does recall that on a couple of the occasions prior to the hives flaring, she had consumed crab meat.  Though the majority times  the hives have developed she has not had shellfish prior to symptom onset. She experiences nasal congestion, rhinorrhea, postnasal drainage, and occasional sinus pressure.  These symptoms occur year around.  She carries albuterol HFA which she uses during bouts of bronchitis, occurring 1 or 2 times per year.   Assessment and plan: Recurrent urticaria Unclear etiology. Skin tests to select food allergens were negative today. NSAIDs and emotional stress commonly exacerbate urticaria but are not the underlying etiology in this case. Physical urticarias are negative by history (i.e. pressure-induced, temperature, vibration, solar, etc.). History and lesions are not consistent with urticaria pigmentosa so I am not suspicious for mastocytosis. There are no concomitant symptoms concerning for anaphylaxis or constitutional symptoms worrisome for an underlying malignancy. We will rule out other potential etiologies with labs. For symptom relief, patient is to take oral antihistamines as directed.  The following labs have been ordered: FCeRI antibody, TSH, anti-thyroglobulin antibody, thyroid peroxidase antibody, tryptase, CBC, CMP, ESR, ANA, and galactose-alpha-1,3-galactose IgE level.  The patient will be called with further recommendations after lab results have returned.  Instructions have been discussed and provided for H1/H2 receptor blockade with titration to find lowest effective dose.  A refill prescription has been provided for levocetirizine 5 mg daily if needed.  A prescription has been provided for ranitidine 150 mg twice daily if needed.  A prescription has been provided for montelukast 10 mg daily at bedtime.  Should there be a significant increase or change in symptoms, a journal is to be kept recording any foods eaten, beverages consumed, medications taken within a 6 hour period prior to the onset of symptoms, as well as record activities being  performed, and environmental conditions. For  any symptoms concerning for anaphylaxis, 911 is to be called immediately.  Food allergy The patient's history suggests shellfish allergy and positive skin test results today confirm this diagnosis.  Meticulous avoidance of shellfish as discussed.  A prescription has been provided for epinephrine auto-injector 2 pack along with instructions for proper administration.  A food allergy action plan has been provided and discussed.  Medic Alert identification is recommended.  Perennial allergic rhinitis with a nonallergic component  Aeroallergen avoidance measures have been discussed and provided in written form.  Levocetirizine and montelukast have been prescribed (as above).  A prescription has been provided for fluticasone nasal spray, one spray per nostril 1-2 times daily as needed. Proper nasal spray technique has been discussed and demonstrated.  Nasal saline spray (i.e., Simply Saline) or nasal saline lavage (i.e., NeilMed) is recommended as needed and prior to medicated nasal sprays.   Meds ordered this encounter  Medications  . levocetirizine (XYZAL) 5 MG tablet    Sig: Take 1 tablet (5 mg total) by mouth every evening.    Dispense:  34 tablet    Refill:  5  . ranitidine (ZANTAC) 150 MG tablet    Sig: Take 1 tablet (150 mg total) by mouth 2 (two) times daily.    Dispense:  60 tablet    Refill:  5  . montelukast (SINGULAIR) 10 MG tablet    Sig: Take 1 tablet (10 mg total) by mouth at bedtime.    Dispense:  34 tablet    Refill:  5  . EPINEPHrine 0.3 mg/0.3 mL IJ SOAJ injection    Sig: Use as directed for severe allergic reactions    Dispense:  4 Device    Refill:  3    Dispense Mylan generic brand only    Diagnostics: Spirometry:  Normal with an FEV1 of 117% predicted.  Please see scanned spirometry results for details. Epicutaneous testing: Negative despite a positive histamine control. Intradermal testing: Borderline positive to dust mite antigen. Food allergen  skin testing: Positive to shrimp, crab, and lobster.    Physical examination: Blood pressure 134/60, pulse 72, temperature 98.4 F (36.9 C), temperature source Oral, resp. rate 20, height 5' 3.35" (1.609 m), weight 198 lb 6.6 oz (90 kg), SpO2 94 %.  General: Alert, interactive, in no acute distress. HEENT: TMs pearly gray, turbinates moderately edematous without discharge, post-pharynx erythematous. Neck: Supple without lymphadenopathy. Lungs: Clear to auscultation without wheezing, rhonchi or rales. CV: Normal S1, S2 without murmurs. Abdomen: Nondistended, nontender. Skin: Warm and dry, without lesions or rashes. Extremities:  No clubbing, cyanosis or edema. Neuro:   Grossly intact.  Review of systems:  Review of systems negative except as noted in HPI / PMHx or noted below: Review of Systems  Constitutional: Negative.   HENT: Negative.   Eyes: Negative.   Respiratory: Negative.   Cardiovascular: Negative.   Gastrointestinal: Negative.   Genitourinary: Negative.   Musculoskeletal: Negative.   Skin: Negative.   Neurological: Negative.   Endo/Heme/Allergies: Negative.   Psychiatric/Behavioral: Negative.     Past medical history:  Past Medical History:  Diagnosis Date  . Arthritis   . Back pain   . CVA (cerebral infarction)   . GERD (gastroesophageal reflux disease)   . Hypercholesteremia   . Hypertension   . Mild intermittent asthma 08/04/2017  . Sleep apnea   . Stroke ([place])   . Thyroid disease   . Urticaria     Past surgical history:  Past  Surgical History:  Procedure Laterality Date  . ABDOMINAL HYSTERECTOMY    . CARPAL TUNNEL RELEASE    . KNEE SURGERY    . NASAL SINUS SURGERY    . NASAL SINUS SURGERY      Family history: Family History  Problem Relation Age of Onset  . Eczema Daughter   . Eczema Grandchild     Social history: Social History   Socioeconomic History  . Marital status: Legally Separated    Spouse name: Not on file  . Number of  children: Not on file  . Years of education: Not on file  . Highest education level: Not on file  Social Needs  . Financial resource strain: Not on file  . Food insecurity - worry: Not on file  . Food insecurity - inability: Not on file  . Transportation needs - medical: Not on file  . Transportation needs - non-medical: Not on file  Occupational History  . Not on file  Tobacco Use  . Smoking status: Never Smoker  . Smokeless tobacco: Never Used  Substance and Sexual Activity  . Alcohol use: No  . Drug use: No  . Sexual activity: Yes    Birth control/protection: Surgical  Other Topics Concern  . Not on file  Social History Narrative  . Not on file   Environmental History: The patient lives in an 55 year old apartment with carpeting throughout and central air/heat.  There is mold/water damage in the apartment.  She is a non-smoker without pets.  Allergies as of 08/04/2017      Reactions   Shellfish Allergy Hives      Medication List        Accurate as of 08/04/17  7:10 PM. Always use your most recent med list.          albuterol 90 MCG/ACT inhaler Commonly known as:  PROVENTIL,VENTOLIN Inhale 2 puffs into the lungs every 6 (six) hours as needed. For shortness of breath   atenolol-chlorthalidone 50-25 MG tablet Commonly known as:  TENORETIC Take 1 tablet by mouth daily.   atorvastatin 80 MG tablet Commonly known as:  LIPITOR Take 80 mg by mouth daily.   baclofen 10 MG tablet Commonly known as:  LIORESAL Take 10 mg by mouth 2 (two) times daily as needed.   diphenhydrAMINE 25 MG tablet Commonly known as:  BENADRYL Take 50 mg by mouth every 6 (six) hours as needed. For allergies   EPINEPHrine 0.3 mg/0.3 mL Soaj injection Commonly known as:  EPI-PEN Use as directed for severe allergic reactions   esomeprazole 40 MG capsule Commonly known as:  NEXIUM Take 40 mg by mouth daily before breakfast.   ibuprofen 800 MG tablet Commonly known as:  ADVIL,MOTRIN     levocetirizine 5 MG tablet Commonly known as:  XYZAL Take 1 tablet (5 mg total) by mouth every evening.   levothyroxine 100 MCG tablet Commonly known as:  SYNTHROID, LEVOTHROID Take 100 mcg by mouth daily.   montelukast 10 MG tablet Commonly known as:  SINGULAIR Take 1 tablet (10 mg total) by mouth at bedtime.   ondansetron 4 MG disintegrating tablet Commonly known as:  ZOFRAN ODT Take 1 tablet (4 mg total) by mouth every 8 (eight) hours as needed for nausea.   potassium chloride SA 20 MEQ tablet Commonly known as:  K-DUR,KLOR-CON Take 20 mEq by mouth daily.   ranitidine 150 MG tablet Commonly known as:  ZANTAC Take 1 tablet (150 mg total) by mouth 2 (two) times daily.  Known medication allergies: Allergies  Allergen Reactions  . Shellfish Allergy Hives    I appreciate the opportunity to take part in Makari's care. Please do not hesitate to contact me with questions.  Sincerely,   R. Edgar Frisk, MD

## 2017-08-04 NOTE — Assessment & Plan Note (Addendum)
The patient's history suggests shellfish allergy and positive skin test results today confirm this diagnosis.  Meticulous avoidance of shellfish as discussed.  A prescription has been provided for epinephrine auto-injector 2 pack along with instructions for proper administration.  A food allergy action plan has been provided and discussed.  Medic Alert identification is recommended. 

## 2017-08-05 ENCOUNTER — Other Ambulatory Visit: Payer: Self-pay | Admitting: Allergy and Immunology

## 2017-08-05 DIAGNOSIS — L509 Urticaria, unspecified: Secondary | ICD-10-CM | POA: Diagnosis not present

## 2017-08-05 DIAGNOSIS — E785 Hyperlipidemia, unspecified: Secondary | ICD-10-CM | POA: Diagnosis not present

## 2017-08-05 DIAGNOSIS — F419 Anxiety disorder, unspecified: Secondary | ICD-10-CM | POA: Diagnosis not present

## 2017-08-05 DIAGNOSIS — E559 Vitamin D deficiency, unspecified: Secondary | ICD-10-CM | POA: Diagnosis not present

## 2017-08-05 DIAGNOSIS — G43909 Migraine, unspecified, not intractable, without status migrainosus: Secondary | ICD-10-CM | POA: Diagnosis not present

## 2017-08-05 DIAGNOSIS — Z91018 Allergy to other foods: Secondary | ICD-10-CM | POA: Diagnosis not present

## 2017-08-05 DIAGNOSIS — K219 Gastro-esophageal reflux disease without esophagitis: Secondary | ICD-10-CM | POA: Diagnosis not present

## 2017-08-05 DIAGNOSIS — J302 Other seasonal allergic rhinitis: Secondary | ICD-10-CM | POA: Diagnosis not present

## 2017-08-05 DIAGNOSIS — L500 Allergic urticaria: Secondary | ICD-10-CM | POA: Diagnosis not present

## 2017-08-05 DIAGNOSIS — E119 Type 2 diabetes mellitus without complications: Secondary | ICD-10-CM | POA: Diagnosis not present

## 2017-08-05 DIAGNOSIS — E039 Hypothyroidism, unspecified: Secondary | ICD-10-CM | POA: Diagnosis not present

## 2017-08-10 LAB — CBC WITH DIFFERENTIAL/PLATELET
Basophils Absolute: 0 10*3/uL (ref 0.0–0.2)
Basos: 1 %
EOS (ABSOLUTE): 0.1 10*3/uL (ref 0.0–0.4)
Eos: 2 %
Hematocrit: 43.1 % (ref 34.0–46.6)
Hemoglobin: 14.6 g/dL (ref 11.1–15.9)
Immature Grans (Abs): 0 10*3/uL (ref 0.0–0.1)
Immature Granulocytes: 0 %
Lymphocytes Absolute: 2.5 10*3/uL (ref 0.7–3.1)
Lymphs: 36 %
MCH: 28.2 pg (ref 26.6–33.0)
MCHC: 33.9 g/dL (ref 31.5–35.7)
MCV: 83 fL (ref 79–97)
Monocytes Absolute: 0.5 10*3/uL (ref 0.1–0.9)
Monocytes: 7 %
Neutrophils Absolute: 3.8 10*3/uL (ref 1.4–7.0)
Neutrophils: 54 %
Platelets: 289 10*3/uL (ref 150–379)
RBC: 5.17 x10E6/uL (ref 3.77–5.28)
RDW: 16 % — ABNORMAL HIGH (ref 12.3–15.4)
WBC: 7 10*3/uL (ref 3.4–10.8)

## 2017-08-10 LAB — COMPREHENSIVE METABOLIC PANEL
ALT: 56 IU/L — ABNORMAL HIGH (ref 0–32)
AST: 32 IU/L (ref 0–40)
Albumin/Globulin Ratio: 1.6 (ref 1.2–2.2)
Albumin: 4.6 g/dL (ref 3.5–5.5)
Alkaline Phosphatase: 71 IU/L (ref 39–117)
BUN/Creatinine Ratio: 12 (ref 9–23)
BUN: 13 mg/dL (ref 6–24)
Bilirubin Total: 0.5 mg/dL (ref 0.0–1.2)
CO2: 27 mmol/L (ref 20–29)
Calcium: 10 mg/dL (ref 8.7–10.2)
Chloride: 100 mmol/L (ref 96–106)
Creatinine, Ser: 1.08 mg/dL — ABNORMAL HIGH (ref 0.57–1.00)
GFR calc Af Amer: 67 mL/min/{1.73_m2} (ref >59)
GFR calc non Af Amer: 58 mL/min/{1.73_m2} — ABNORMAL LOW (ref >59)
Globulin, Total: 2.8 g/dL (ref 1.5–4.5)
Glucose: 98 mg/dL (ref 65–99)
Potassium: 2.6 mmol/L — ABNORMAL LOW (ref 3.5–5.2)
Sodium: 144 mmol/L (ref 134–144)
Total Protein: 7.4 g/dL (ref 6.0–8.5)

## 2017-08-10 LAB — SEDIMENTATION RATE: Sed Rate: 48 mm/hr — ABNORMAL HIGH (ref 0–40)

## 2017-08-10 LAB — ALPHA-GAL PANEL
Alpha Gal IgE*: <0.1 kU/L (ref <0.10)
Beef (Bos spp) IgE: <0.1 kU/L (ref <0.35)
Class Interpretation: 0
Class Interpretation: 0
Class Interpretation: 0
Lamb/Mutton (Ovis spp) IgE: <0.1 kU/L (ref <0.35)
Pork (Sus spp) IgE: <0.1 kU/L (ref <0.35)

## 2017-08-10 LAB — TRYPTASE: Tryptase: 6.3 ug/L (ref 2.2–13.2)

## 2017-08-10 LAB — THYROID PEROXIDASE ANTIBODY: Thyroperoxidase Ab SerPl-aCnc: 12 IU/mL (ref 0–34)

## 2017-08-10 LAB — ANA: Anti Nuclear Antibody(ANA): NEGATIVE

## 2017-08-10 LAB — THYROGLOBULIN ANTIBODY: Thyroglobulin Antibody: <1 IU/mL (ref 0.0–0.9)

## 2017-08-10 LAB — CHRONIC URTICARIA: cu index: <2.6 (ref <10)

## 2017-09-08 DIAGNOSIS — J101 Influenza due to other identified influenza virus with other respiratory manifestations: Secondary | ICD-10-CM | POA: Diagnosis not present

## 2017-09-14 DIAGNOSIS — E559 Vitamin D deficiency, unspecified: Secondary | ICD-10-CM | POA: Diagnosis not present

## 2017-09-14 DIAGNOSIS — K219 Gastro-esophageal reflux disease without esophagitis: Secondary | ICD-10-CM | POA: Diagnosis not present

## 2017-09-14 DIAGNOSIS — Z5181 Encounter for therapeutic drug level monitoring: Secondary | ICD-10-CM | POA: Diagnosis not present

## 2017-09-14 DIAGNOSIS — E039 Hypothyroidism, unspecified: Secondary | ICD-10-CM | POA: Diagnosis not present

## 2017-09-14 DIAGNOSIS — J302 Other seasonal allergic rhinitis: Secondary | ICD-10-CM | POA: Diagnosis not present

## 2017-09-14 DIAGNOSIS — E785 Hyperlipidemia, unspecified: Secondary | ICD-10-CM | POA: Diagnosis not present

## 2017-09-14 DIAGNOSIS — Z0000 Encounter for general adult medical examination without abnormal findings: Secondary | ICD-10-CM | POA: Diagnosis not present

## 2017-09-14 DIAGNOSIS — F419 Anxiety disorder, unspecified: Secondary | ICD-10-CM | POA: Diagnosis not present

## 2017-09-14 DIAGNOSIS — G43909 Migraine, unspecified, not intractable, without status migrainosus: Secondary | ICD-10-CM | POA: Diagnosis not present

## 2017-09-14 DIAGNOSIS — E119 Type 2 diabetes mellitus without complications: Secondary | ICD-10-CM | POA: Diagnosis not present

## 2017-09-22 DIAGNOSIS — Z1231 Encounter for screening mammogram for malignant neoplasm of breast: Secondary | ICD-10-CM | POA: Diagnosis not present

## 2017-11-18 DIAGNOSIS — J302 Other seasonal allergic rhinitis: Secondary | ICD-10-CM | POA: Diagnosis not present

## 2017-11-18 DIAGNOSIS — K219 Gastro-esophageal reflux disease without esophagitis: Secondary | ICD-10-CM | POA: Diagnosis not present

## 2017-11-18 DIAGNOSIS — G43909 Migraine, unspecified, not intractable, without status migrainosus: Secondary | ICD-10-CM | POA: Diagnosis not present

## 2017-11-18 DIAGNOSIS — E039 Hypothyroidism, unspecified: Secondary | ICD-10-CM | POA: Diagnosis not present

## 2017-11-18 DIAGNOSIS — I10 Essential (primary) hypertension: Secondary | ICD-10-CM | POA: Diagnosis not present

## 2017-11-18 DIAGNOSIS — E119 Type 2 diabetes mellitus without complications: Secondary | ICD-10-CM | POA: Diagnosis not present

## 2017-11-18 DIAGNOSIS — E785 Hyperlipidemia, unspecified: Secondary | ICD-10-CM | POA: Diagnosis not present

## 2017-11-18 DIAGNOSIS — E876 Hypokalemia: Secondary | ICD-10-CM | POA: Diagnosis not present

## 2017-11-18 DIAGNOSIS — E559 Vitamin D deficiency, unspecified: Secondary | ICD-10-CM | POA: Diagnosis not present

## 2017-11-18 DIAGNOSIS — F419 Anxiety disorder, unspecified: Secondary | ICD-10-CM | POA: Diagnosis not present

## 2017-11-18 DIAGNOSIS — H109 Unspecified conjunctivitis: Secondary | ICD-10-CM | POA: Diagnosis not present

## 2017-11-29 DIAGNOSIS — H16103 Unspecified superficial keratitis, bilateral: Secondary | ICD-10-CM | POA: Diagnosis not present

## 2017-12-28 DIAGNOSIS — E559 Vitamin D deficiency, unspecified: Secondary | ICD-10-CM | POA: Diagnosis not present

## 2017-12-28 DIAGNOSIS — K219 Gastro-esophageal reflux disease without esophagitis: Secondary | ICD-10-CM | POA: Diagnosis not present

## 2017-12-28 DIAGNOSIS — G43909 Migraine, unspecified, not intractable, without status migrainosus: Secondary | ICD-10-CM | POA: Diagnosis not present

## 2017-12-28 DIAGNOSIS — S39012A Strain of muscle, fascia and tendon of lower back, initial encounter: Secondary | ICD-10-CM | POA: Diagnosis not present

## 2017-12-28 DIAGNOSIS — J302 Other seasonal allergic rhinitis: Secondary | ICD-10-CM | POA: Diagnosis not present

## 2017-12-28 DIAGNOSIS — E785 Hyperlipidemia, unspecified: Secondary | ICD-10-CM | POA: Diagnosis not present

## 2017-12-28 DIAGNOSIS — E876 Hypokalemia: Secondary | ICD-10-CM | POA: Diagnosis not present

## 2017-12-28 DIAGNOSIS — E119 Type 2 diabetes mellitus without complications: Secondary | ICD-10-CM | POA: Diagnosis not present

## 2017-12-28 DIAGNOSIS — I10 Essential (primary) hypertension: Secondary | ICD-10-CM | POA: Diagnosis not present

## 2017-12-28 DIAGNOSIS — E039 Hypothyroidism, unspecified: Secondary | ICD-10-CM | POA: Diagnosis not present

## 2017-12-28 DIAGNOSIS — F419 Anxiety disorder, unspecified: Secondary | ICD-10-CM | POA: Diagnosis not present

## 2018-01-07 DIAGNOSIS — E876 Hypokalemia: Secondary | ICD-10-CM | POA: Diagnosis not present

## 2018-01-14 DIAGNOSIS — I10 Essential (primary) hypertension: Secondary | ICD-10-CM | POA: Diagnosis not present

## 2018-01-14 DIAGNOSIS — E785 Hyperlipidemia, unspecified: Secondary | ICD-10-CM | POA: Diagnosis not present

## 2018-01-14 DIAGNOSIS — K219 Gastro-esophageal reflux disease without esophagitis: Secondary | ICD-10-CM | POA: Diagnosis not present

## 2018-01-14 DIAGNOSIS — G43909 Migraine, unspecified, not intractable, without status migrainosus: Secondary | ICD-10-CM | POA: Diagnosis not present

## 2018-01-14 DIAGNOSIS — F419 Anxiety disorder, unspecified: Secondary | ICD-10-CM | POA: Diagnosis not present

## 2018-01-14 DIAGNOSIS — J302 Other seasonal allergic rhinitis: Secondary | ICD-10-CM | POA: Diagnosis not present

## 2018-01-14 DIAGNOSIS — E119 Type 2 diabetes mellitus without complications: Secondary | ICD-10-CM | POA: Diagnosis not present

## 2018-01-14 DIAGNOSIS — E039 Hypothyroidism, unspecified: Secondary | ICD-10-CM | POA: Diagnosis not present

## 2018-01-14 DIAGNOSIS — E559 Vitamin D deficiency, unspecified: Secondary | ICD-10-CM | POA: Diagnosis not present

## 2018-02-07 ENCOUNTER — Other Ambulatory Visit: Payer: Self-pay | Admitting: Allergy and Immunology

## 2018-02-07 DIAGNOSIS — L500 Allergic urticaria: Secondary | ICD-10-CM

## 2018-02-16 ENCOUNTER — Other Ambulatory Visit: Payer: Self-pay | Admitting: Allergy and Immunology

## 2018-02-16 DIAGNOSIS — J4520 Mild intermittent asthma, uncomplicated: Secondary | ICD-10-CM

## 2018-02-16 DIAGNOSIS — J3089 Other allergic rhinitis: Secondary | ICD-10-CM

## 2018-02-16 NOTE — Telephone Encounter (Signed)
Courtesy refill  

## 2018-03-07 DIAGNOSIS — M2242 Chondromalacia patellae, left knee: Secondary | ICD-10-CM | POA: Diagnosis not present

## 2018-03-07 DIAGNOSIS — M25562 Pain in left knee: Secondary | ICD-10-CM | POA: Diagnosis not present

## 2018-03-07 DIAGNOSIS — G8929 Other chronic pain: Secondary | ICD-10-CM | POA: Diagnosis not present

## 2018-03-07 DIAGNOSIS — M1712 Unilateral primary osteoarthritis, left knee: Secondary | ICD-10-CM | POA: Diagnosis not present

## 2018-03-19 ENCOUNTER — Emergency Department (HOSPITAL_BASED_OUTPATIENT_CLINIC_OR_DEPARTMENT_OTHER)
Admission: EM | Admit: 2018-03-19 | Discharge: 2018-03-19 | Disposition: A | Discharge status: Home / Self Care | Payer: Medicare Other | Attending: Emergency Medicine | Admitting: Emergency Medicine

## 2018-03-19 ENCOUNTER — Encounter (HOSPITAL_BASED_OUTPATIENT_CLINIC_OR_DEPARTMENT_OTHER): Payer: Self-pay | Admitting: Adult Health

## 2018-03-19 ENCOUNTER — Other Ambulatory Visit: Payer: Self-pay

## 2018-03-19 DIAGNOSIS — J45909 Unspecified asthma, uncomplicated: Secondary | ICD-10-CM | POA: Diagnosis not present

## 2018-03-19 DIAGNOSIS — J019 Acute sinusitis, unspecified: Secondary | ICD-10-CM | POA: Insufficient documentation

## 2018-03-19 DIAGNOSIS — I10 Essential (primary) hypertension: Secondary | ICD-10-CM | POA: Diagnosis not present

## 2018-03-19 DIAGNOSIS — Z79899 Other long term (current) drug therapy: Secondary | ICD-10-CM | POA: Insufficient documentation

## 2018-03-19 DIAGNOSIS — R519 Headache, unspecified: Secondary | ICD-10-CM

## 2018-03-19 DIAGNOSIS — Z8673 Personal history of transient ischemic attack (TIA), and cerebral infarction without residual deficits: Secondary | ICD-10-CM | POA: Insufficient documentation

## 2018-03-19 DIAGNOSIS — R51 Headache: Secondary | ICD-10-CM | POA: Diagnosis not present

## 2018-03-19 MED ORDER — FLUTICASONE PROPIONATE 50 MCG/ACT NA SUSP: 2.0000 | Freq: Every day | NASAL | 0 refills | Status: AC

## 2018-03-19 MED ORDER — KETOROLAC TROMETHAMINE 60 MG/2ML IM SOLN
30.0000 mg | Freq: Once | INTRAMUSCULAR | Status: AC
  Administered 2018-03-19: 30 mg via INTRAMUSCULAR
  Filled 2018-03-19: qty 2

## 2018-03-19 NOTE — ED Provider Notes (Signed)
[facility] EMERGENCY DEPARTMENT Provider Note   CSN: [id number] Arrival date & time: 03/19/18  1234     History   Chief Complaint Chief Complaint  Patient presents with  . Facial Pain    HPI Tiffany Larsen is a 57 y.o. female.  HPI  57 year old female presents with frontal headache and frontal sinus pain.  Has history of on and off sinus troubles and had sinus surgery many years ago.  Felt a headache yesterday and now her frontal and maxillary sinuses are tender.  She denies any congestion or rhinorrhea but has had a little bit of postnasal drip.  Has tried some nasal spray she was given by her allergist but states is not Flonase.  She denies any fever, vomiting, blurry vision.  No significant ear pain.  She had a little bit of a sore throat earlier but that resolved.  No cough or trouble breathing.  Past Medical History:  Diagnosis Date  . Arthritis   . Back pain   . CVA (cerebral infarction)   . GERD (gastroesophageal reflux disease)   . Hypercholesteremia   . Hypertension   . Mild intermittent asthma 08/04/2017  . Sleep apnea   . Stroke ([facility])   . Thyroid disease   . Urticaria     Patient Active Problem List   Diagnosis Date Noted  . Recurrent urticaria 08/04/2017  . Food allergy 08/04/2017  . Perennial allergic rhinitis with a nonallergic component 08/04/2017  . Mild intermittent asthma 08/04/2017    Past Surgical History:  Procedure Laterality Date  . ABDOMINAL HYSTERECTOMY    . CARPAL TUNNEL RELEASE    . KNEE SURGERY    . NASAL SINUS SURGERY    . NASAL SINUS SURGERY       OB History   None      Home Medications    Prior to Admission medications   Medication Sig Start Date End Date Taking? Authorizing Provider  albuterol (PROVENTIL,VENTOLIN) 90 MCG/ACT inhaler Inhale 2 puffs into the lungs every 6 (six) hours as needed. For shortness of breath    [provider]  atenolol-chlorthalidone (TENORETIC) 50-25 MG per tablet Take 1 tablet  by mouth daily.      [provider]  atorvastatin (LIPITOR) 80 MG tablet Take 80 mg by mouth daily.    [provider]  baclofen (LIORESAL) 10 MG tablet Take 10 mg by mouth 2 (two) times daily as needed. 06/05/17   [provider]  diphenhydrAMINE (BENADRYL) 25 MG tablet Take 50 mg by mouth every 6 (six) hours as needed. For allergies    [provider]  EPINEPHrine 0.3 mg/0.3 mL IJ SOAJ injection Use as directed for severe allergic reactions 08/04/17   Bobbitt, Heywood Iles, MD  esomeprazole (NEXIUM) 40 MG capsule Take 40 mg by mouth daily before breakfast.      [provider]  ibuprofen (ADVIL,MOTRIN) 800 MG tablet  05/25/17   [provider]  levocetirizine (XYZAL) 5 MG tablet TAKE 1 TABLET BY MOUTH EVERY DAY IN THE EVENING 02/16/18   Bobbitt, Heywood Iles, MD  levothyroxine (SYNTHROID, LEVOTHROID) 100 MCG tablet Take 100 mcg by mouth daily.    [provider]  montelukast (SINGULAIR) 10 MG tablet TAKE 1 TABLET BY MOUTH EVERYDAY AT BEDTIME 02/16/18   Bobbitt, Heywood Iles, MD  ondansetron (ZOFRAN ODT) 4 MG disintegrating tablet Take 1 tablet (4 mg total) by mouth every 8 (eight) hours as needed for nausea. 02/22/13   Elson Areas,  PA-C  potassium chloride SA (K-DUR,KLOR-CON) 20 MEQ tablet Take 20 mEq by mouth daily.      [provider]  ranitidine (ZANTAC) 150 MG tablet TAKE 1 TABLET BY MOUTH TWICE A DAY 02/07/18   Bobbitt, Heywood Ilesalph Carter, MD    Family History Family History  Problem Relation Age of Onset  . Eczema Daughter   . Eczema Grandchild     Social History Social History   Tobacco Use  . Smoking status: Never Smoker  . Smokeless tobacco: Never Used  Substance Use Topics  . Alcohol use: No  . Drug use: No     Allergies   Shellfish allergy   Review of Systems Review of Systems  Constitutional: Negative for fever.  HENT: Positive for sinus pressure and sinus pain.   Respiratory: Negative for cough  and shortness of breath.   Gastrointestinal: Negative for vomiting.  Neurological: Positive for headaches. Negative for weakness and numbness.  All other systems reviewed and are negative.    Physical Exam Updated Vital Signs BP (!) 146/66   Pulse (!) 52   Temp 98.6 F (37 C) (Oral)   Resp 18   Ht 5\' 2"  (1.575 m)   Wt 89.8 kg   SpO2 98%   BMI 36.21 kg/m   Physical Exam  Constitutional: She is oriented to person, place, and time. She appears well-developed and well-nourished.  HENT:  Head: Normocephalic and atraumatic.  Right Ear: External ear normal.  Left Ear: Tympanic membrane, external ear and ear canal normal.  Nose: Right sinus exhibits maxillary sinus tenderness and frontal sinus tenderness. Left sinus exhibits maxillary sinus tenderness and frontal sinus tenderness.  Mouth/Throat: No oropharyngeal exudate.  Mild wax in right ear canal. Limits view of TM but no obvious otitis seen on what parts I can see  Eyes: Right eye exhibits no discharge. Left eye exhibits no discharge.  Neck: Neck supple.  Cardiovascular: Normal rate, regular rhythm and normal heart sounds.  Pulmonary/Chest: Effort normal and breath sounds normal.  Abdominal: Soft. There is no tenderness.  Neurological: She is alert and oriented to person, place, and time.  CN 3-12 grossly intact. 5/5 strength in all 4 extremities. Grossly normal sensation. Normal finger to nose.   Skin: Skin is warm and dry.  Nursing note and vitals reviewed.    ED Treatments / Results  Labs (all labs ordered are listed, but only abnormal results are displayed) Labs Reviewed - No data to display  EKG None  Radiology No results found.  Procedures Procedures (including critical care time)  Medications Ordered in ED Medications - No data to display   Initial Impression / Assessment and Plan / ED Course  I have reviewed the triage vital signs and the nursing notes.  Pertinent labs & imaging results that were  available during my care of the patient were reviewed by me and considered in my medical decision making (see chart for details).     Patient has some mild diffuse maxillary and frontal sinus tenderness.  She probably has increased sinus pressure from congestion but without fever, drainage or purulent drainage, I have low suspicion for an acute bacterial sinusitis.  We discussed measures to help relieve the pressure and I will prescribe her Flonase in addition to her other antiallergy medicines.  We also discussed return precautions but at this point she appears stable for discharge home.  The headache is most likely related to the sinus pressure and I have low suspicion of an acute intracranial  emergency.  I will give her a dose of IM Toradol prior to discharge home.  Final Clinical Impressions(s) / ED Diagnoses   Final diagnoses:  Sinus headache  Acute sinusitis, recurrence not specified, unspecified location    ED Discharge Orders    None       Pricilla Loveless, MD 03/19/18 1354

## 2018-03-19 NOTE — ED Triage Notes (Signed)
PResents with facial pain and congestion that began yesterday. TEnderness with palpation over ethmoid, maxillary and frontal sinuses. Denies fevers. Describes pain as severe.

## 2018-03-19 NOTE — Discharge Instructions (Signed)
If you develop worsening pain, vomiting, fever, abnormal nasal discharge, trouble breathing, or any other new/concerning symptoms and return to the ER for evaluation

## 2018-03-27 ENCOUNTER — Other Ambulatory Visit: Payer: Self-pay | Admitting: Allergy and Immunology

## 2018-03-27 DIAGNOSIS — J4520 Mild intermittent asthma, uncomplicated: Secondary | ICD-10-CM

## 2018-03-27 DIAGNOSIS — J3089 Other allergic rhinitis: Secondary | ICD-10-CM

## 2018-03-31 DIAGNOSIS — K219 Gastro-esophageal reflux disease without esophagitis: Secondary | ICD-10-CM | POA: Diagnosis not present

## 2018-03-31 DIAGNOSIS — E119 Type 2 diabetes mellitus without complications: Secondary | ICD-10-CM | POA: Diagnosis not present

## 2018-03-31 DIAGNOSIS — E039 Hypothyroidism, unspecified: Secondary | ICD-10-CM | POA: Diagnosis not present

## 2018-03-31 DIAGNOSIS — F419 Anxiety disorder, unspecified: Secondary | ICD-10-CM | POA: Diagnosis not present

## 2018-03-31 DIAGNOSIS — G43909 Migraine, unspecified, not intractable, without status migrainosus: Secondary | ICD-10-CM | POA: Diagnosis not present

## 2018-03-31 DIAGNOSIS — E785 Hyperlipidemia, unspecified: Secondary | ICD-10-CM | POA: Diagnosis not present

## 2018-03-31 DIAGNOSIS — J302 Other seasonal allergic rhinitis: Secondary | ICD-10-CM | POA: Diagnosis not present

## 2018-03-31 DIAGNOSIS — E559 Vitamin D deficiency, unspecified: Secondary | ICD-10-CM | POA: Diagnosis not present

## 2018-03-31 DIAGNOSIS — I10 Essential (primary) hypertension: Secondary | ICD-10-CM | POA: Diagnosis not present

## 2018-04-25 ENCOUNTER — Other Ambulatory Visit: Payer: Self-pay | Admitting: Allergy and Immunology

## 2018-04-25 DIAGNOSIS — J3089 Other allergic rhinitis: Secondary | ICD-10-CM

## 2018-04-25 DIAGNOSIS — J4520 Mild intermittent asthma, uncomplicated: Secondary | ICD-10-CM

## 2018-05-05 DIAGNOSIS — Z23 Encounter for immunization: Secondary | ICD-10-CM | POA: Diagnosis not present

## 2018-05-05 DIAGNOSIS — I10 Essential (primary) hypertension: Secondary | ICD-10-CM | POA: Diagnosis not present

## 2018-05-05 DIAGNOSIS — K219 Gastro-esophageal reflux disease without esophagitis: Secondary | ICD-10-CM | POA: Diagnosis not present

## 2018-05-05 DIAGNOSIS — E559 Vitamin D deficiency, unspecified: Secondary | ICD-10-CM | POA: Diagnosis not present

## 2018-05-05 DIAGNOSIS — E119 Type 2 diabetes mellitus without complications: Secondary | ICD-10-CM | POA: Diagnosis not present

## 2018-05-05 DIAGNOSIS — M791 Myalgia, unspecified site: Secondary | ICD-10-CM | POA: Diagnosis not present

## 2018-05-05 DIAGNOSIS — F419 Anxiety disorder, unspecified: Secondary | ICD-10-CM | POA: Diagnosis not present

## 2018-05-05 DIAGNOSIS — E039 Hypothyroidism, unspecified: Secondary | ICD-10-CM | POA: Diagnosis not present

## 2018-05-05 DIAGNOSIS — J302 Other seasonal allergic rhinitis: Secondary | ICD-10-CM | POA: Diagnosis not present

## 2018-05-05 DIAGNOSIS — E876 Hypokalemia: Secondary | ICD-10-CM | POA: Diagnosis not present

## 2018-05-05 DIAGNOSIS — G43909 Migraine, unspecified, not intractable, without status migrainosus: Secondary | ICD-10-CM | POA: Diagnosis not present

## 2018-05-05 DIAGNOSIS — E785 Hyperlipidemia, unspecified: Secondary | ICD-10-CM | POA: Diagnosis not present

## 2018-05-13 ENCOUNTER — Other Ambulatory Visit: Payer: Self-pay | Admitting: Allergy and Immunology

## 2018-05-13 DIAGNOSIS — J3089 Other allergic rhinitis: Secondary | ICD-10-CM

## 2018-05-13 DIAGNOSIS — M205X1 Other deformities of toe(s) (acquired), right foot: Secondary | ICD-10-CM | POA: Diagnosis not present

## 2018-05-13 DIAGNOSIS — M722 Plantar fascial fibromatosis: Secondary | ICD-10-CM | POA: Diagnosis not present

## 2018-05-13 DIAGNOSIS — E119 Type 2 diabetes mellitus without complications: Secondary | ICD-10-CM | POA: Diagnosis not present

## 2018-05-13 DIAGNOSIS — M6702 Short Achilles tendon (acquired), left ankle: Secondary | ICD-10-CM | POA: Diagnosis not present

## 2018-05-13 DIAGNOSIS — M6701 Short Achilles tendon (acquired), right ankle: Secondary | ICD-10-CM | POA: Diagnosis not present

## 2018-06-03 DIAGNOSIS — M6701 Short Achilles tendon (acquired), right ankle: Secondary | ICD-10-CM | POA: Diagnosis not present

## 2018-06-03 DIAGNOSIS — M722 Plantar fascial fibromatosis: Secondary | ICD-10-CM | POA: Diagnosis not present

## 2018-06-03 DIAGNOSIS — M6702 Short Achilles tendon (acquired), left ankle: Secondary | ICD-10-CM | POA: Diagnosis not present

## 2018-06-08 ENCOUNTER — Other Ambulatory Visit: Payer: Self-pay | Admitting: Allergy and Immunology

## 2018-06-08 DIAGNOSIS — J3089 Other allergic rhinitis: Secondary | ICD-10-CM

## 2018-06-20 DIAGNOSIS — H5203 Hypermetropia, bilateral: Secondary | ICD-10-CM | POA: Diagnosis not present

## 2018-06-20 DIAGNOSIS — H524 Presbyopia: Secondary | ICD-10-CM | POA: Diagnosis not present

## 2018-06-20 DIAGNOSIS — R7303 Prediabetes: Secondary | ICD-10-CM | POA: Diagnosis not present

## 2018-06-20 DIAGNOSIS — H17822 Peripheral opacity of cornea, left eye: Secondary | ICD-10-CM | POA: Diagnosis not present

## 2018-06-20 DIAGNOSIS — H04123 Dry eye syndrome of bilateral lacrimal glands: Secondary | ICD-10-CM | POA: Diagnosis not present

## 2018-06-20 DIAGNOSIS — H43393 Other vitreous opacities, bilateral: Secondary | ICD-10-CM | POA: Diagnosis not present

## 2018-06-20 DIAGNOSIS — H2513 Age-related nuclear cataract, bilateral: Secondary | ICD-10-CM | POA: Diagnosis not present

## 2018-06-20 DIAGNOSIS — H16223 Keratoconjunctivitis sicca, not specified as Sjogren's, bilateral: Secondary | ICD-10-CM | POA: Diagnosis not present

## 2018-06-20 DIAGNOSIS — H52203 Unspecified astigmatism, bilateral: Secondary | ICD-10-CM | POA: Diagnosis not present

## 2018-06-21 DIAGNOSIS — E785 Hyperlipidemia, unspecified: Secondary | ICD-10-CM | POA: Diagnosis not present

## 2018-06-21 DIAGNOSIS — E559 Vitamin D deficiency, unspecified: Secondary | ICD-10-CM | POA: Diagnosis not present

## 2018-06-21 DIAGNOSIS — I10 Essential (primary) hypertension: Secondary | ICD-10-CM | POA: Diagnosis not present

## 2018-06-21 DIAGNOSIS — J302 Other seasonal allergic rhinitis: Secondary | ICD-10-CM | POA: Diagnosis not present

## 2018-06-21 DIAGNOSIS — E876 Hypokalemia: Secondary | ICD-10-CM | POA: Diagnosis not present

## 2018-06-21 DIAGNOSIS — F419 Anxiety disorder, unspecified: Secondary | ICD-10-CM | POA: Diagnosis not present

## 2018-06-21 DIAGNOSIS — K219 Gastro-esophageal reflux disease without esophagitis: Secondary | ICD-10-CM | POA: Diagnosis not present

## 2018-06-21 DIAGNOSIS — E039 Hypothyroidism, unspecified: Secondary | ICD-10-CM | POA: Diagnosis not present

## 2018-06-21 DIAGNOSIS — Z0001 Encounter for general adult medical examination with abnormal findings: Secondary | ICD-10-CM | POA: Diagnosis not present

## 2018-06-21 DIAGNOSIS — G43909 Migraine, unspecified, not intractable, without status migrainosus: Secondary | ICD-10-CM | POA: Diagnosis not present

## 2018-06-21 DIAGNOSIS — E119 Type 2 diabetes mellitus without complications: Secondary | ICD-10-CM | POA: Diagnosis not present

## 2018-09-08 DIAGNOSIS — E785 Hyperlipidemia, unspecified: Secondary | ICD-10-CM | POA: Diagnosis not present

## 2018-09-08 DIAGNOSIS — E119 Type 2 diabetes mellitus without complications: Secondary | ICD-10-CM | POA: Diagnosis not present

## 2018-09-08 DIAGNOSIS — K219 Gastro-esophageal reflux disease without esophagitis: Secondary | ICD-10-CM | POA: Diagnosis not present

## 2018-09-08 DIAGNOSIS — Z5181 Encounter for therapeutic drug level monitoring: Secondary | ICD-10-CM | POA: Diagnosis not present

## 2018-09-08 DIAGNOSIS — I10 Essential (primary) hypertension: Secondary | ICD-10-CM | POA: Diagnosis not present

## 2018-09-08 DIAGNOSIS — R310 Gross hematuria: Secondary | ICD-10-CM | POA: Diagnosis not present

## 2018-09-08 DIAGNOSIS — F419 Anxiety disorder, unspecified: Secondary | ICD-10-CM | POA: Diagnosis not present

## 2018-09-08 DIAGNOSIS — E559 Vitamin D deficiency, unspecified: Secondary | ICD-10-CM | POA: Diagnosis not present

## 2018-09-08 DIAGNOSIS — J302 Other seasonal allergic rhinitis: Secondary | ICD-10-CM | POA: Diagnosis not present

## 2018-09-08 DIAGNOSIS — G43909 Migraine, unspecified, not intractable, without status migrainosus: Secondary | ICD-10-CM | POA: Diagnosis not present

## 2018-09-08 DIAGNOSIS — E039 Hypothyroidism, unspecified: Secondary | ICD-10-CM | POA: Diagnosis not present

## 2018-09-08 DIAGNOSIS — E876 Hypokalemia: Secondary | ICD-10-CM | POA: Diagnosis not present

## 2018-09-08 DIAGNOSIS — Z0000 Encounter for general adult medical examination without abnormal findings: Secondary | ICD-10-CM | POA: Diagnosis not present

## 2018-09-26 DIAGNOSIS — Z1231 Encounter for screening mammogram for malignant neoplasm of breast: Secondary | ICD-10-CM | POA: Diagnosis not present

## 2018-10-11 DIAGNOSIS — E559 Vitamin D deficiency, unspecified: Secondary | ICD-10-CM | POA: Diagnosis not present

## 2018-10-11 DIAGNOSIS — E039 Hypothyroidism, unspecified: Secondary | ICD-10-CM | POA: Diagnosis not present

## 2018-10-11 DIAGNOSIS — I10 Essential (primary) hypertension: Secondary | ICD-10-CM | POA: Diagnosis not present

## 2018-10-11 DIAGNOSIS — E119 Type 2 diabetes mellitus without complications: Secondary | ICD-10-CM | POA: Diagnosis not present

## 2018-10-11 DIAGNOSIS — E785 Hyperlipidemia, unspecified: Secondary | ICD-10-CM | POA: Diagnosis not present

## 2018-10-11 DIAGNOSIS — G43909 Migraine, unspecified, not intractable, without status migrainosus: Secondary | ICD-10-CM | POA: Diagnosis not present

## 2018-10-11 DIAGNOSIS — E876 Hypokalemia: Secondary | ICD-10-CM | POA: Diagnosis not present

## 2018-10-11 DIAGNOSIS — J302 Other seasonal allergic rhinitis: Secondary | ICD-10-CM | POA: Diagnosis not present

## 2018-10-11 DIAGNOSIS — K219 Gastro-esophageal reflux disease without esophagitis: Secondary | ICD-10-CM | POA: Diagnosis not present

## 2018-10-11 DIAGNOSIS — F419 Anxiety disorder, unspecified: Secondary | ICD-10-CM | POA: Diagnosis not present

## 2018-10-13 ENCOUNTER — Encounter (HOSPITAL_BASED_OUTPATIENT_CLINIC_OR_DEPARTMENT_OTHER): Payer: Self-pay | Admitting: *Deleted

## 2018-10-13 ENCOUNTER — Emergency Department (HOSPITAL_BASED_OUTPATIENT_CLINIC_OR_DEPARTMENT_OTHER)
Admission: EM | Admit: 2018-10-13 | Discharge: 2018-10-13 | Disposition: A | Discharge status: Home / Self Care | Payer: Medicare Other | Attending: Emergency Medicine | Admitting: Emergency Medicine

## 2018-10-13 ENCOUNTER — Other Ambulatory Visit: Payer: Self-pay

## 2018-10-13 DIAGNOSIS — K5900 Constipation, unspecified: Secondary | ICD-10-CM | POA: Insufficient documentation

## 2018-10-13 DIAGNOSIS — I10 Essential (primary) hypertension: Secondary | ICD-10-CM | POA: Insufficient documentation

## 2018-10-13 DIAGNOSIS — K5641 Fecal impaction: Secondary | ICD-10-CM

## 2018-10-13 MED ORDER — PEG 3350-KCL-NABCB-NACL-NASULF 236 G PO SOLR: 4000.0000 mL | Freq: Once | ORAL | 0 refills | Status: AC

## 2018-10-13 MED FILL — PEG-3350 AND ELECTROLYTES S: 236 | 1 days supply | Qty: 4000 | Fill #0

## 2018-10-13 NOTE — Discharge Instructions (Addendum)
You were evaluated in the Emergency Department and after careful evaluation, we did not find any emergent condition requiring admission or further testing in the hospital.  Your symptoms today seem to be due to a fecal impaction.  We did our best to loosen the stool here in the emergency department.  Please continue enemas and suppositories at home.  You can also use the more aggressive stool softening regimen provided.  If this does not work, you can return to the emergency department for further assistance.  Please return to the Emergency Department if you experience any worsening of your condition.  We encourage you to follow up with a primary care provider.  Thank you for allowing [place] to be a part of your care.

## 2018-10-13 NOTE — ED Triage Notes (Signed)
Constipation. She used an enema and suppositories with no relief.

## 2018-10-13 NOTE — ED Provider Notes (Signed)
[facility] Emergency Department Provider Note MRN:  [id number]  Arrival date & time: 10/13/18     Chief Complaint   Abdominal Pain and Constipation   History of Present Illness   Tiffany Larsen is a 58 y.o. year-old female with a history of CVA presenting to the ED with chief complaint of constipation.  Patient endorsing 3 or 4 days without a bowel movement.  Feels a large stool ball in her rectal vault, cannot pass it.  Endorsing rectal pain.  Denies abdominal pain but feeling abdominal fullness.  Denies nausea or vomiting, no fever, no chest pain or shortness of breath, no vaginal bleeding or discharge.  Pain is moderate, constant, no exacerbating relieving factors.  Has tried enemas and suppositories at home without relief.  Review of Systems  A complete 10 system review of systems was obtained and all systems are negative except as noted in the HPI and PMH.   Patient's Health History    Past Medical History:  Diagnosis Date  . Arthritis   . Back pain   . CVA (cerebral infarction)   . GERD (gastroesophageal reflux disease)   . Hypercholesteremia   . Hypertension   . Mild intermittent asthma 08/04/2017  . Sleep apnea   . Stroke (HCC)   . Thyroid disease   . Urticaria     Past Surgical History:  Procedure Laterality Date  . ABDOMINAL HYSTERECTOMY    . CARPAL TUNNEL RELEASE    . KNEE SURGERY    . NASAL SINUS SURGERY    . NASAL SINUS SURGERY      Family History  Problem Relation Age of Onset  . Eczema Daughter   . Eczema Grandchild     Social History   Socioeconomic History  . Marital status: Legally Separated    Spouse name: Not on file  . Number of children: Not on file  . Years of education: Not on file  . Highest education level: Not on file  Occupational History  . Not on file  Social Needs  . Financial resource strain: Not on file  . Food insecurity:    Worry: Not on file    Inability: Not on file  . Transportation needs:   Medical: Not on file    Non-medical: Not on file  Tobacco Use  . Smoking status: Never Smoker  . Smokeless tobacco: Never Used  Substance and Sexual Activity  . Alcohol use: No  . Drug use: No  . Sexual activity: Yes    Birth control/protection: Surgical  Lifestyle  . Physical activity:    Days per week: Not on file    Minutes per session: Not on file  . Stress: Not on file  Relationships  . Social connections:    Talks on phone: Not on file    Gets together: Not on file    Attends religious service: Not on file    Active member of club or organization: Not on file    Attends meetings of clubs or organizations: Not on file    Relationship status: Not on file  . Intimate partner violence:    Fear of current or ex partner: Not on file    Emotionally abused: Not on file    Physically abused: Not on file    Forced sexual activity: Not on file  Other Topics Concern  . Not on file  Social History Narrative  . Not on file     Physical Exam  Vital Signs and  Nursing Notes reviewed Vitals:   10/13/18 1139  BP: (!) 157/71  Pulse: 62  Resp: 14  Temp: 98.1 F (36.7 C)  SpO2: 99%    CONSTITUTIONAL: Well-appearing, NAD NEURO:  Alert and oriented x 3, no focal deficits EYES:  eyes equal and reactive ENT/NECK:  no LAD, no JVD CARDIO: Regular rate, well-perfused, normal S1 and S2 PULM:  CTAB no wheezing or rhonchi GI/GU:  normal bowel sounds, non-distended, non-tender MSK/SPINE:  No gross deformities, no edema SKIN:  no rash, atraumatic PSYCH:  Appropriate speech and behavior  Diagnostic and Interventional Summary    Labs Reviewed - No data to display  No orders to display    Medications - No data to display   Fecal disimpaction Date/Time: 10/13/2018 11:56 AM Performed by: Sabas Sous, MD Authorized by: Sabas Sous, MD  Consent: Verbal consent obtained. Risks and benefits: risks, benefits and alternatives were discussed Consent given by: patient Patient  understanding: patient states understanding of the procedure being performed Patient tolerance: Patient tolerated the procedure well with no immediate complications Comments: Solid stool present in rectal vault, adhered to rectal wall.  Dislodged from rectal wall with sweeping motion of the finger, removal of small amount of stool that was within reach.     Critical Care  ED Course and Medical Decision Making  I have reviewed the triage vital signs and the nursing notes.  Pertinent labs & imaging results that were available during my care of the patient were reviewed by me and considered in my medical decision making (see below for details).  Normal vital signs, benign abdominal exam, history provided consistent with fecal impaction, confirmed by physical exam and disimpaction attempt described above.  Will provide a more aggressive bowel regiment and advised continued enemas and suppositories at home.  After the discussed management above, the patient was determined to be safe for discharge.  The patient was in agreement with this plan and all questions regarding their care were answered.  ED return precautions were discussed and the patient will return to the ED with any significant worsening of condition.  Elmer Sow. Pilar Plate, MD [facility] Emergency Medicine [facility] [email or web address]  Final Clinical Impressions(s) / ED Diagnoses     ICD-10-CM   1. Constipation, unspecified constipation type K59.00   2. Fecal impaction [employer]) K56.41     ED Discharge Orders         Ordered    polyethylene glycol (GOLYTELY) 236 g solution   Once     10/13/18 1215             Sabas Sous, MD 10/13/18 1218

## 2018-11-07 ENCOUNTER — Other Ambulatory Visit: Payer: Self-pay | Admitting: Allergy and Immunology

## 2018-11-07 DIAGNOSIS — J3089 Other allergic rhinitis: Secondary | ICD-10-CM

## 2018-12-09 DIAGNOSIS — I10 Essential (primary) hypertension: Secondary | ICD-10-CM | POA: Diagnosis not present

## 2018-12-09 DIAGNOSIS — F419 Anxiety disorder, unspecified: Secondary | ICD-10-CM | POA: Diagnosis not present

## 2018-12-09 DIAGNOSIS — G43909 Migraine, unspecified, not intractable, without status migrainosus: Secondary | ICD-10-CM | POA: Diagnosis not present

## 2018-12-09 DIAGNOSIS — E039 Hypothyroidism, unspecified: Secondary | ICD-10-CM | POA: Diagnosis not present

## 2018-12-09 DIAGNOSIS — J0100 Acute maxillary sinusitis, unspecified: Secondary | ICD-10-CM | POA: Diagnosis not present

## 2018-12-09 DIAGNOSIS — E876 Hypokalemia: Secondary | ICD-10-CM | POA: Diagnosis not present

## 2018-12-09 DIAGNOSIS — E785 Hyperlipidemia, unspecified: Secondary | ICD-10-CM | POA: Diagnosis not present

## 2018-12-09 DIAGNOSIS — E119 Type 2 diabetes mellitus without complications: Secondary | ICD-10-CM | POA: Diagnosis not present

## 2018-12-09 DIAGNOSIS — E559 Vitamin D deficiency, unspecified: Secondary | ICD-10-CM | POA: Diagnosis not present

## 2018-12-09 DIAGNOSIS — J302 Other seasonal allergic rhinitis: Secondary | ICD-10-CM | POA: Diagnosis not present

## 2018-12-09 DIAGNOSIS — K219 Gastro-esophageal reflux disease without esophagitis: Secondary | ICD-10-CM | POA: Diagnosis not present

## 2019-01-17 DIAGNOSIS — F419 Anxiety disorder, unspecified: Secondary | ICD-10-CM | POA: Diagnosis not present

## 2019-01-17 DIAGNOSIS — G43909 Migraine, unspecified, not intractable, without status migrainosus: Secondary | ICD-10-CM | POA: Diagnosis not present

## 2019-01-17 DIAGNOSIS — I10 Essential (primary) hypertension: Secondary | ICD-10-CM | POA: Diagnosis not present

## 2019-01-17 DIAGNOSIS — E119 Type 2 diabetes mellitus without complications: Secondary | ICD-10-CM | POA: Diagnosis not present

## 2019-01-17 DIAGNOSIS — E785 Hyperlipidemia, unspecified: Secondary | ICD-10-CM | POA: Diagnosis not present

## 2019-01-17 DIAGNOSIS — K219 Gastro-esophageal reflux disease without esophagitis: Secondary | ICD-10-CM | POA: Diagnosis not present

## 2019-01-17 DIAGNOSIS — E039 Hypothyroidism, unspecified: Secondary | ICD-10-CM | POA: Diagnosis not present

## 2019-01-17 DIAGNOSIS — E559 Vitamin D deficiency, unspecified: Secondary | ICD-10-CM | POA: Diagnosis not present

## 2019-01-17 DIAGNOSIS — J302 Other seasonal allergic rhinitis: Secondary | ICD-10-CM | POA: Diagnosis not present

## 2019-02-08 DIAGNOSIS — M6701 Short Achilles tendon (acquired), right ankle: Secondary | ICD-10-CM | POA: Diagnosis not present

## 2019-02-08 DIAGNOSIS — M6702 Short Achilles tendon (acquired), left ankle: Secondary | ICD-10-CM | POA: Diagnosis not present

## 2019-02-08 DIAGNOSIS — M722 Plantar fascial fibromatosis: Secondary | ICD-10-CM | POA: Diagnosis not present

## 2019-02-08 DIAGNOSIS — E119 Type 2 diabetes mellitus without complications: Secondary | ICD-10-CM | POA: Diagnosis not present

## 2019-03-13 DIAGNOSIS — M6702 Short Achilles tendon (acquired), left ankle: Secondary | ICD-10-CM | POA: Diagnosis not present

## 2019-03-13 DIAGNOSIS — M6701 Short Achilles tendon (acquired), right ankle: Secondary | ICD-10-CM | POA: Diagnosis not present

## 2019-03-13 DIAGNOSIS — E119 Type 2 diabetes mellitus without complications: Secondary | ICD-10-CM | POA: Diagnosis not present

## 2019-03-13 DIAGNOSIS — M722 Plantar fascial fibromatosis: Secondary | ICD-10-CM | POA: Diagnosis not present

## 2019-04-04 DIAGNOSIS — E785 Hyperlipidemia, unspecified: Secondary | ICD-10-CM | POA: Diagnosis not present

## 2019-04-04 DIAGNOSIS — E119 Type 2 diabetes mellitus without complications: Secondary | ICD-10-CM | POA: Diagnosis not present

## 2019-04-04 DIAGNOSIS — E559 Vitamin D deficiency, unspecified: Secondary | ICD-10-CM | POA: Diagnosis not present

## 2019-04-04 DIAGNOSIS — I10 Essential (primary) hypertension: Secondary | ICD-10-CM | POA: Diagnosis not present

## 2019-04-04 DIAGNOSIS — K219 Gastro-esophageal reflux disease without esophagitis: Secondary | ICD-10-CM | POA: Diagnosis not present

## 2019-04-04 DIAGNOSIS — J302 Other seasonal allergic rhinitis: Secondary | ICD-10-CM | POA: Diagnosis not present

## 2019-04-04 DIAGNOSIS — F419 Anxiety disorder, unspecified: Secondary | ICD-10-CM | POA: Diagnosis not present

## 2019-04-04 DIAGNOSIS — J0100 Acute maxillary sinusitis, unspecified: Secondary | ICD-10-CM | POA: Diagnosis not present

## 2019-04-04 DIAGNOSIS — G43909 Migraine, unspecified, not intractable, without status migrainosus: Secondary | ICD-10-CM | POA: Diagnosis not present

## 2019-04-04 DIAGNOSIS — E039 Hypothyroidism, unspecified: Secondary | ICD-10-CM | POA: Diagnosis not present

## 2019-04-07 DIAGNOSIS — Z23 Encounter for immunization: Secondary | ICD-10-CM | POA: Diagnosis not present

## 2019-06-20 DIAGNOSIS — E119 Type 2 diabetes mellitus without complications: Secondary | ICD-10-CM | POA: Diagnosis not present

## 2019-06-22 ENCOUNTER — Other Ambulatory Visit: Payer: Self-pay

## 2019-06-22 ENCOUNTER — Encounter (HOSPITAL_BASED_OUTPATIENT_CLINIC_OR_DEPARTMENT_OTHER): Payer: Self-pay | Admitting: Emergency Medicine

## 2019-06-22 ENCOUNTER — Emergency Department (HOSPITAL_BASED_OUTPATIENT_CLINIC_OR_DEPARTMENT_OTHER)
Admission: EM | Admit: 2019-06-22 | Discharge: 2019-06-22 | Disposition: A | Discharge status: Home / Self Care | Payer: Medicare Other | Attending: Emergency Medicine | Admitting: Emergency Medicine

## 2019-06-22 DIAGNOSIS — I10 Essential (primary) hypertension: Secondary | ICD-10-CM | POA: Insufficient documentation

## 2019-06-22 DIAGNOSIS — Z79899 Other long term (current) drug therapy: Secondary | ICD-10-CM | POA: Diagnosis not present

## 2019-06-22 DIAGNOSIS — Z20828 Contact with and (suspected) exposure to other viral communicable diseases: Secondary | ICD-10-CM | POA: Diagnosis not present

## 2019-06-22 DIAGNOSIS — R0981 Nasal congestion: Secondary | ICD-10-CM | POA: Diagnosis present

## 2019-06-22 DIAGNOSIS — J019 Acute sinusitis, unspecified: Secondary | ICD-10-CM | POA: Diagnosis not present

## 2019-06-22 MED ORDER — AMOXICILLIN-POT CLAVULANATE 875-125 MG PO TABS: 1.0000 | ORAL_TABLET | Freq: Two times a day (BID) | ORAL | 0 refills | Status: AC

## 2019-06-22 NOTE — Discharge Instructions (Addendum)
You were seen in the emergency department for symptoms of facial pain and sinus congestion.  This is likely sinusitis and you should continue your prednisone.  We are changing your antibiotic.  Please keep well-hydrated.  Tylenol and ibuprofen for pain.  Follow-up with your doctor return if any worsening symptoms.

## 2019-06-22 NOTE — ED Triage Notes (Signed)
Sinus congestion and pain x6 days.  Tested for Covid 3 days ago - negative result.  Saw pmd 2 days ago and given Azithromycin and steroids. Continues to have sinus pain and tenderness.

## 2019-06-22 NOTE — ED Provider Notes (Signed)
[facility] EMERGENCY DEPARTMENT Provider Note   CSN: [id number] Arrival date & time: 06/22/19  0802     History   Chief Complaint Chief Complaint  Patient presents with  . sinus congestion    HPI Tiffany Larsen is a 58 y.o. female.  She is complaining of 4 days of sinus congestion facial pain postnasal drip.  Low-grade fever.  Had a Covid test 3 days ago negative.  Saw PCP 2 days ago started on Zithromax and oral steroids for sinusitis.  Does not feel any improvement.  No nausea vomiting diarrhea.  Minimal cough.     The history is provided by the patient.  URI Presenting symptoms: congestion, cough, facial pain and fever   Presenting symptoms: no rhinorrhea and no sore throat   Severity:  Moderate Onset quality:  Gradual Timing:  Intermittent Chronicity:  New Relieved by:  Nothing Worsened by:  Nothing Ineffective treatments:  OTC medications Associated symptoms: sinus pain   Associated symptoms: no arthralgias, no myalgias, no neck pain and no wheezing     Past Medical History:  Diagnosis Date  . Arthritis   . Back pain   . CVA (cerebral infarction)   . GERD (gastroesophageal reflux disease)   . Hypercholesteremia   . Hypertension   . Mild intermittent asthma 08/04/2017  . Sleep apnea   . Stroke (HCC)   . Thyroid disease   . Urticaria     Patient Active Problem List   Diagnosis Date Noted  . Recurrent urticaria 08/04/2017  . Food allergy 08/04/2017  . Perennial allergic rhinitis with a nonallergic component 08/04/2017  . Mild intermittent asthma 08/04/2017    Past Surgical History:  Procedure Laterality Date  . ABDOMINAL HYSTERECTOMY    . CARPAL TUNNEL RELEASE    . KNEE SURGERY    . NASAL SINUS SURGERY    . NASAL SINUS SURGERY       OB History   No obstetric history on file.      Home Medications    Prior to Admission medications   Medication Sig Start Date End Date Taking? Authorizing Provider  albuterol (PROVENTIL,VENTOLIN) 90  MCG/ACT inhaler Inhale 2 puffs into the lungs every 6 (six) hours as needed. For shortness of breath    [provider]  atenolol-chlorthalidone (TENORETIC) 50-25 MG per tablet Take 1 tablet by mouth daily.      [provider]  atorvastatin (LIPITOR) 80 MG tablet Take 80 mg by mouth daily.    [provider]  baclofen (LIORESAL) 10 MG tablet Take 10 mg by mouth 2 (two) times daily as needed. 06/05/17   [provider]  diphenhydrAMINE (BENADRYL) 25 MG tablet Take 50 mg by mouth every 6 (six) hours as needed. For allergies    [provider]  EPINEPHrine 0.3 mg/0.3 mL IJ SOAJ injection Use as directed for severe allergic reactions 08/04/17   Bobbitt, Heywood Iles, MD  esomeprazole (NEXIUM) 40 MG capsule Take 40 mg by mouth daily before breakfast.      [provider]  fluticasone (FLONASE) 50 MCG/ACT nasal spray Place 2 sprays into both nostrils daily. 03/19/18   Pricilla Loveless, MD  ibuprofen (ADVIL,MOTRIN) 800 MG tablet  05/25/17   [provider]  levocetirizine (XYZAL) 5 MG tablet TAKE 1 TABLET BY MOUTH EVERY DAY IN THE EVENING 06/08/18   Bobbitt, Heywood Iles, MD  levothyroxine (SYNTHROID, LEVOTHROID) 100 MCG tablet Take 100 mcg by mouth daily.    [provider]  montelukast (  SINGULAIR) 10 MG tablet TAKE 1 TABLET BY MOUTH EVERYDAY AT BEDTIME 03/29/18   Bobbitt, Heywood Ilesalph Carter, MD  ondansetron (ZOFRAN ODT) 4 MG disintegrating tablet Take 1 tablet (4 mg total) by mouth every 8 (eight) hours as needed for nausea. 02/22/13   Elson AreasSofia, Leslie K, PA-C  potassium chloride SA (K-DUR,KLOR-CON) 20 MEQ tablet Take 20 mEq by mouth daily.      [provider]  ranitidine (ZANTAC) 150 MG tablet TAKE 1 TABLET BY MOUTH TWICE A DAY 02/07/18   Bobbitt, Heywood Ilesalph Carter, MD    Family History Family History  Problem Relation Age of Onset  . Eczema Daughter   . Eczema Grandchild     Social History Social History   Tobacco Use  . Smoking  status: Never Smoker  . Smokeless tobacco: Never Used  Substance Use Topics  . Alcohol use: No  . Drug use: No     Allergies   Shellfish allergy   Review of Systems Review of Systems  Constitutional: Positive for fever.  HENT: Positive for congestion and sinus pain. Negative for rhinorrhea and sore throat.   Eyes: Negative for visual disturbance.  Respiratory: Positive for cough. Negative for wheezing.   Gastrointestinal: Negative for nausea and vomiting.  Genitourinary: Negative for dysuria.  Musculoskeletal: Negative for arthralgias, myalgias and neck pain.  Skin: Negative for rash.  Neurological: Negative for speech difficulty.     Physical Exam Updated Vital Signs BP (!) 152/78 (BP Location: Right Arm)   Pulse 73   Temp 99.3 F (37.4 C) (Oral) Comment: Tylenol taken 2 hours ago  Resp 16   Ht 5' 2.5" (1.588 m)   Wt 91.3 kg   SpO2 97%   BMI 36.21 kg/m   Physical Exam Vitals signs and nursing note reviewed.  Constitutional:      General: She is not in acute distress.    Appearance: She is well-developed.  HENT:     Head: Normocephalic and atraumatic.     Right Ear: Tympanic membrane normal.     Left Ear: Tympanic membrane normal.     Nose:     Right Sinus: Maxillary sinus tenderness and frontal sinus tenderness present.     Left Sinus: Maxillary sinus tenderness and frontal sinus tenderness present.     Mouth/Throat:     Mouth: Mucous membranes are moist.     Pharynx: Oropharynx is clear.  Eyes:     Conjunctiva/sclera: Conjunctivae normal.  Neck:     Musculoskeletal: Neck supple.  Cardiovascular:     Rate and Rhythm: Normal rate and regular rhythm.     Heart sounds: No murmur.  Pulmonary:     Effort: Pulmonary effort is normal. No respiratory distress.     Breath sounds: Normal breath sounds.  Abdominal:     Palpations: Abdomen is soft.     Tenderness: There is no abdominal tenderness.  Skin:    General: Skin is warm and dry.  Neurological:      Mental Status: She is alert.      ED Treatments / Results  Labs (all labs ordered are listed, but only abnormal results are displayed) Labs Reviewed - No data to display  EKG None  Radiology No results found.  Procedures Procedures (including critical care time)  Medications Ordered in ED Medications - No data to display   Initial Impression / Assessment and Plan / ED Course  I have reviewed the triage vital signs and the nursing notes.  Pertinent labs & imaging results  that were available during my care of the patient were reviewed by me and considered in my medical decision making (see chart for details).  Clinical Course as of Jun 21 1232  Thu Jun 21, 5460  2358 58 year old female with history of sinus disease here with low-grade fever sinus pressure and pain postnasal drip.  Already on Zithromax and prednisone.  Will switch to Augmentin.   [MB]    Clinical Course User Index [MB] Hayden Rasmussen, MD        Final Clinical Impressions(s) / ED Diagnoses   Final diagnoses:  Acute sinusitis, recurrence not specified, unspecified location    ED Discharge Orders         Ordered    amoxicillin-clavulanate (AUGMENTIN) 875-125 MG tablet  Every 12 hours     06/22/19 0817           Hayden Rasmussen, MD 06/22/19 1234

## 2019-10-12 DIAGNOSIS — Z5181 Encounter for therapeutic drug level monitoring: Secondary | ICD-10-CM | POA: Diagnosis not present

## 2019-10-12 DIAGNOSIS — K219 Gastro-esophageal reflux disease without esophagitis: Secondary | ICD-10-CM | POA: Diagnosis not present

## 2019-10-12 DIAGNOSIS — E119 Type 2 diabetes mellitus without complications: Secondary | ICD-10-CM | POA: Diagnosis not present

## 2019-10-12 DIAGNOSIS — E559 Vitamin D deficiency, unspecified: Secondary | ICD-10-CM | POA: Diagnosis not present

## 2019-10-12 DIAGNOSIS — I10 Essential (primary) hypertension: Secondary | ICD-10-CM | POA: Diagnosis not present

## 2019-10-12 DIAGNOSIS — E039 Hypothyroidism, unspecified: Secondary | ICD-10-CM | POA: Diagnosis not present

## 2019-10-12 DIAGNOSIS — F419 Anxiety disorder, unspecified: Secondary | ICD-10-CM | POA: Diagnosis not present

## 2019-10-12 DIAGNOSIS — G43909 Migraine, unspecified, not intractable, without status migrainosus: Secondary | ICD-10-CM | POA: Diagnosis not present

## 2019-10-12 DIAGNOSIS — J302 Other seasonal allergic rhinitis: Secondary | ICD-10-CM | POA: Diagnosis not present

## 2019-10-12 DIAGNOSIS — E785 Hyperlipidemia, unspecified: Secondary | ICD-10-CM | POA: Diagnosis not present

## 2019-10-17 DIAGNOSIS — Z23 Encounter for immunization: Secondary | ICD-10-CM | POA: Diagnosis not present

## 2019-11-07 DIAGNOSIS — Z23 Encounter for immunization: Secondary | ICD-10-CM | POA: Diagnosis not present

## 2019-12-20 DIAGNOSIS — Z1231 Encounter for screening mammogram for malignant neoplasm of breast: Secondary | ICD-10-CM | POA: Diagnosis not present

## 2020-01-23 DIAGNOSIS — Z5181 Encounter for therapeutic drug level monitoring: Secondary | ICD-10-CM | POA: Diagnosis not present

## 2020-01-23 DIAGNOSIS — E559 Vitamin D deficiency, unspecified: Secondary | ICD-10-CM | POA: Diagnosis not present

## 2020-01-23 DIAGNOSIS — F419 Anxiety disorder, unspecified: Secondary | ICD-10-CM | POA: Diagnosis not present

## 2020-01-23 DIAGNOSIS — J302 Other seasonal allergic rhinitis: Secondary | ICD-10-CM | POA: Diagnosis not present

## 2020-01-23 DIAGNOSIS — I10 Essential (primary) hypertension: Secondary | ICD-10-CM | POA: Diagnosis not present

## 2020-01-23 DIAGNOSIS — E039 Hypothyroidism, unspecified: Secondary | ICD-10-CM | POA: Diagnosis not present

## 2020-01-23 DIAGNOSIS — K219 Gastro-esophageal reflux disease without esophagitis: Secondary | ICD-10-CM | POA: Diagnosis not present

## 2020-01-23 DIAGNOSIS — G43909 Migraine, unspecified, not intractable, without status migrainosus: Secondary | ICD-10-CM | POA: Diagnosis not present

## 2020-01-23 DIAGNOSIS — E785 Hyperlipidemia, unspecified: Secondary | ICD-10-CM | POA: Diagnosis not present

## 2020-01-23 DIAGNOSIS — E119 Type 2 diabetes mellitus without complications: Secondary | ICD-10-CM | POA: Diagnosis not present

## 2020-04-25 DIAGNOSIS — Z5181 Encounter for therapeutic drug level monitoring: Secondary | ICD-10-CM | POA: Diagnosis not present

## 2020-04-25 DIAGNOSIS — I10 Essential (primary) hypertension: Secondary | ICD-10-CM | POA: Diagnosis not present

## 2020-04-25 DIAGNOSIS — E039 Hypothyroidism, unspecified: Secondary | ICD-10-CM | POA: Diagnosis not present

## 2020-04-25 DIAGNOSIS — F419 Anxiety disorder, unspecified: Secondary | ICD-10-CM | POA: Diagnosis not present

## 2020-04-25 DIAGNOSIS — Z23 Encounter for immunization: Secondary | ICD-10-CM | POA: Diagnosis not present

## 2020-04-25 DIAGNOSIS — E785 Hyperlipidemia, unspecified: Secondary | ICD-10-CM | POA: Diagnosis not present

## 2020-04-25 DIAGNOSIS — K219 Gastro-esophageal reflux disease without esophagitis: Secondary | ICD-10-CM | POA: Diagnosis not present

## 2020-04-25 DIAGNOSIS — E119 Type 2 diabetes mellitus without complications: Secondary | ICD-10-CM | POA: Diagnosis not present

## 2020-04-25 DIAGNOSIS — E559 Vitamin D deficiency, unspecified: Secondary | ICD-10-CM | POA: Diagnosis not present

## 2020-04-25 DIAGNOSIS — J302 Other seasonal allergic rhinitis: Secondary | ICD-10-CM | POA: Diagnosis not present

## 2020-04-25 DIAGNOSIS — G43909 Migraine, unspecified, not intractable, without status migrainosus: Secondary | ICD-10-CM | POA: Diagnosis not present

## 2020-05-19 DIAGNOSIS — Z23 Encounter for immunization: Secondary | ICD-10-CM | POA: Diagnosis not present

## 2020-06-03 DIAGNOSIS — H43393 Other vitreous opacities, bilateral: Secondary | ICD-10-CM | POA: Diagnosis not present

## 2020-06-03 DIAGNOSIS — H524 Presbyopia: Secondary | ICD-10-CM | POA: Diagnosis not present

## 2020-06-03 DIAGNOSIS — R7303 Prediabetes: Secondary | ICD-10-CM | POA: Diagnosis not present

## 2020-06-03 DIAGNOSIS — H52203 Unspecified astigmatism, bilateral: Secondary | ICD-10-CM | POA: Diagnosis not present

## 2020-06-03 DIAGNOSIS — H2513 Age-related nuclear cataract, bilateral: Secondary | ICD-10-CM | POA: Diagnosis not present

## 2020-06-03 DIAGNOSIS — H16223 Keratoconjunctivitis sicca, not specified as Sjogren's, bilateral: Secondary | ICD-10-CM | POA: Diagnosis not present

## 2020-06-03 DIAGNOSIS — H5203 Hypermetropia, bilateral: Secondary | ICD-10-CM | POA: Diagnosis not present

## 2020-06-03 DIAGNOSIS — H17822 Peripheral opacity of cornea, left eye: Secondary | ICD-10-CM | POA: Diagnosis not present

## 2020-07-13 DIAGNOSIS — Z20822 Contact with and (suspected) exposure to covid-19: Secondary | ICD-10-CM | POA: Diagnosis not present

## 2020-07-16 DIAGNOSIS — J0100 Acute maxillary sinusitis, unspecified: Secondary | ICD-10-CM | POA: Diagnosis not present

## 2020-08-24 DIAGNOSIS — F419 Anxiety disorder, unspecified: Secondary | ICD-10-CM | POA: Diagnosis not present

## 2020-08-24 DIAGNOSIS — R002 Palpitations: Secondary | ICD-10-CM | POA: Diagnosis not present

## 2020-08-24 DIAGNOSIS — R9431 Abnormal electrocardiogram [ECG] [EKG]: Secondary | ICD-10-CM | POA: Diagnosis not present

## 2020-08-24 DIAGNOSIS — R739 Hyperglycemia, unspecified: Secondary | ICD-10-CM | POA: Diagnosis not present

## 2020-08-24 DIAGNOSIS — R0789 Other chest pain: Secondary | ICD-10-CM | POA: Diagnosis not present

## 2020-08-25 DIAGNOSIS — R9431 Abnormal electrocardiogram [ECG] [EKG]: Secondary | ICD-10-CM | POA: Diagnosis not present

## 2020-08-26 DIAGNOSIS — Z0001 Encounter for general adult medical examination with abnormal findings: Secondary | ICD-10-CM | POA: Diagnosis not present

## 2020-08-26 DIAGNOSIS — G43909 Migraine, unspecified, not intractable, without status migrainosus: Secondary | ICD-10-CM | POA: Diagnosis not present

## 2020-08-26 DIAGNOSIS — I10 Essential (primary) hypertension: Secondary | ICD-10-CM | POA: Diagnosis not present

## 2020-08-26 DIAGNOSIS — K219 Gastro-esophageal reflux disease without esophagitis: Secondary | ICD-10-CM | POA: Diagnosis not present

## 2020-08-26 DIAGNOSIS — F419 Anxiety disorder, unspecified: Secondary | ICD-10-CM | POA: Diagnosis not present

## 2020-08-26 DIAGNOSIS — J302 Other seasonal allergic rhinitis: Secondary | ICD-10-CM | POA: Diagnosis not present

## 2020-08-26 DIAGNOSIS — E039 Hypothyroidism, unspecified: Secondary | ICD-10-CM | POA: Diagnosis not present

## 2020-08-26 DIAGNOSIS — E559 Vitamin D deficiency, unspecified: Secondary | ICD-10-CM | POA: Diagnosis not present

## 2020-08-26 DIAGNOSIS — E785 Hyperlipidemia, unspecified: Secondary | ICD-10-CM | POA: Diagnosis not present

## 2020-08-26 DIAGNOSIS — E119 Type 2 diabetes mellitus without complications: Secondary | ICD-10-CM | POA: Diagnosis not present

## 2020-10-17 DIAGNOSIS — I10 Essential (primary) hypertension: Secondary | ICD-10-CM | POA: Diagnosis not present

## 2020-10-17 DIAGNOSIS — E785 Hyperlipidemia, unspecified: Secondary | ICD-10-CM | POA: Diagnosis not present

## 2020-10-17 DIAGNOSIS — G43909 Migraine, unspecified, not intractable, without status migrainosus: Secondary | ICD-10-CM | POA: Diagnosis not present

## 2020-10-17 DIAGNOSIS — J302 Other seasonal allergic rhinitis: Secondary | ICD-10-CM | POA: Diagnosis not present

## 2020-10-17 DIAGNOSIS — E039 Hypothyroidism, unspecified: Secondary | ICD-10-CM | POA: Diagnosis not present

## 2020-10-17 DIAGNOSIS — F419 Anxiety disorder, unspecified: Secondary | ICD-10-CM | POA: Diagnosis not present

## 2020-10-17 DIAGNOSIS — K219 Gastro-esophageal reflux disease without esophagitis: Secondary | ICD-10-CM | POA: Diagnosis not present

## 2020-10-17 DIAGNOSIS — E119 Type 2 diabetes mellitus without complications: Secondary | ICD-10-CM | POA: Diagnosis not present

## 2020-10-17 DIAGNOSIS — E559 Vitamin D deficiency, unspecified: Secondary | ICD-10-CM | POA: Diagnosis not present

## 2020-12-25 DIAGNOSIS — Z1231 Encounter for screening mammogram for malignant neoplasm of breast: Secondary | ICD-10-CM | POA: Diagnosis not present

## 2021-01-06 DIAGNOSIS — E119 Type 2 diabetes mellitus without complications: Secondary | ICD-10-CM | POA: Diagnosis not present

## 2021-01-06 DIAGNOSIS — F419 Anxiety disorder, unspecified: Secondary | ICD-10-CM | POA: Diagnosis not present

## 2021-01-06 DIAGNOSIS — E785 Hyperlipidemia, unspecified: Secondary | ICD-10-CM | POA: Diagnosis not present

## 2021-01-06 DIAGNOSIS — E559 Vitamin D deficiency, unspecified: Secondary | ICD-10-CM | POA: Diagnosis not present

## 2021-01-06 DIAGNOSIS — K219 Gastro-esophageal reflux disease without esophagitis: Secondary | ICD-10-CM | POA: Diagnosis not present

## 2021-01-06 DIAGNOSIS — E039 Hypothyroidism, unspecified: Secondary | ICD-10-CM | POA: Diagnosis not present

## 2021-01-06 DIAGNOSIS — I10 Essential (primary) hypertension: Secondary | ICD-10-CM | POA: Diagnosis not present

## 2021-01-06 DIAGNOSIS — J302 Other seasonal allergic rhinitis: Secondary | ICD-10-CM | POA: Diagnosis not present

## 2021-01-06 DIAGNOSIS — G43909 Migraine, unspecified, not intractable, without status migrainosus: Secondary | ICD-10-CM | POA: Diagnosis not present

## 2021-01-06 DIAGNOSIS — J0100 Acute maxillary sinusitis, unspecified: Secondary | ICD-10-CM | POA: Diagnosis not present

## 2021-04-14 DIAGNOSIS — E559 Vitamin D deficiency, unspecified: Secondary | ICD-10-CM | POA: Diagnosis not present

## 2021-04-14 DIAGNOSIS — E785 Hyperlipidemia, unspecified: Secondary | ICD-10-CM | POA: Diagnosis not present

## 2021-04-14 DIAGNOSIS — J302 Other seasonal allergic rhinitis: Secondary | ICD-10-CM | POA: Diagnosis not present

## 2021-04-14 DIAGNOSIS — G43909 Migraine, unspecified, not intractable, without status migrainosus: Secondary | ICD-10-CM | POA: Diagnosis not present

## 2021-04-14 DIAGNOSIS — E039 Hypothyroidism, unspecified: Secondary | ICD-10-CM | POA: Diagnosis not present

## 2021-04-14 DIAGNOSIS — F419 Anxiety disorder, unspecified: Secondary | ICD-10-CM | POA: Diagnosis not present

## 2021-04-14 DIAGNOSIS — K219 Gastro-esophageal reflux disease without esophagitis: Secondary | ICD-10-CM | POA: Diagnosis not present

## 2021-04-14 DIAGNOSIS — J0100 Acute maxillary sinusitis, unspecified: Secondary | ICD-10-CM | POA: Diagnosis not present

## 2021-04-14 DIAGNOSIS — I10 Essential (primary) hypertension: Secondary | ICD-10-CM | POA: Diagnosis not present

## 2021-04-14 DIAGNOSIS — E119 Type 2 diabetes mellitus without complications: Secondary | ICD-10-CM | POA: Diagnosis not present

## 2021-05-28 DIAGNOSIS — Z23 Encounter for immunization: Secondary | ICD-10-CM | POA: Diagnosis not present

## 2021-06-16 DIAGNOSIS — H43393 Other vitreous opacities, bilateral: Secondary | ICD-10-CM | POA: Diagnosis not present

## 2021-06-16 DIAGNOSIS — H2513 Age-related nuclear cataract, bilateral: Secondary | ICD-10-CM | POA: Diagnosis not present

## 2021-06-16 DIAGNOSIS — H524 Presbyopia: Secondary | ICD-10-CM | POA: Diagnosis not present

## 2021-06-16 DIAGNOSIS — H17822 Peripheral opacity of cornea, left eye: Secondary | ICD-10-CM | POA: Diagnosis not present

## 2021-06-16 DIAGNOSIS — H04123 Dry eye syndrome of bilateral lacrimal glands: Secondary | ICD-10-CM | POA: Diagnosis not present

## 2021-06-16 DIAGNOSIS — R7303 Prediabetes: Secondary | ICD-10-CM | POA: Diagnosis not present

## 2021-06-16 DIAGNOSIS — H52203 Unspecified astigmatism, bilateral: Secondary | ICD-10-CM | POA: Diagnosis not present

## 2021-06-16 DIAGNOSIS — H5203 Hypermetropia, bilateral: Secondary | ICD-10-CM | POA: Diagnosis not present

## 2021-07-11 DIAGNOSIS — R3915 Urgency of urination: Secondary | ICD-10-CM | POA: Diagnosis not present

## 2021-07-11 DIAGNOSIS — E785 Hyperlipidemia, unspecified: Secondary | ICD-10-CM | POA: Diagnosis not present

## 2021-07-11 DIAGNOSIS — J302 Other seasonal allergic rhinitis: Secondary | ICD-10-CM | POA: Diagnosis not present

## 2021-07-11 DIAGNOSIS — I10 Essential (primary) hypertension: Secondary | ICD-10-CM | POA: Diagnosis not present

## 2021-07-11 DIAGNOSIS — E559 Vitamin D deficiency, unspecified: Secondary | ICD-10-CM | POA: Diagnosis not present

## 2021-07-11 DIAGNOSIS — G43909 Migraine, unspecified, not intractable, without status migrainosus: Secondary | ICD-10-CM | POA: Diagnosis not present

## 2021-07-11 DIAGNOSIS — E039 Hypothyroidism, unspecified: Secondary | ICD-10-CM | POA: Diagnosis not present

## 2021-07-11 DIAGNOSIS — Z0001 Encounter for general adult medical examination with abnormal findings: Secondary | ICD-10-CM | POA: Diagnosis not present

## 2021-07-11 DIAGNOSIS — F419 Anxiety disorder, unspecified: Secondary | ICD-10-CM | POA: Diagnosis not present

## 2021-07-11 DIAGNOSIS — K219 Gastro-esophageal reflux disease without esophagitis: Secondary | ICD-10-CM | POA: Diagnosis not present

## 2021-07-11 DIAGNOSIS — E119 Type 2 diabetes mellitus without complications: Secondary | ICD-10-CM | POA: Diagnosis not present

## 2021-07-31 DIAGNOSIS — M25562 Pain in left knee: Secondary | ICD-10-CM | POA: Diagnosis not present

## 2021-08-21 DIAGNOSIS — M2241 Chondromalacia patellae, right knee: Secondary | ICD-10-CM | POA: Diagnosis not present

## 2021-08-21 DIAGNOSIS — M2242 Chondromalacia patellae, left knee: Secondary | ICD-10-CM | POA: Diagnosis not present

## 2021-09-30 DIAGNOSIS — Z5181 Encounter for therapeutic drug level monitoring: Secondary | ICD-10-CM | POA: Diagnosis not present

## 2021-09-30 DIAGNOSIS — J302 Other seasonal allergic rhinitis: Secondary | ICD-10-CM | POA: Diagnosis not present

## 2021-09-30 DIAGNOSIS — E559 Vitamin D deficiency, unspecified: Secondary | ICD-10-CM | POA: Diagnosis not present

## 2021-09-30 DIAGNOSIS — E119 Type 2 diabetes mellitus without complications: Secondary | ICD-10-CM | POA: Diagnosis not present

## 2021-09-30 DIAGNOSIS — E039 Hypothyroidism, unspecified: Secondary | ICD-10-CM | POA: Diagnosis not present

## 2021-09-30 DIAGNOSIS — K219 Gastro-esophageal reflux disease without esophagitis: Secondary | ICD-10-CM | POA: Diagnosis not present

## 2021-09-30 DIAGNOSIS — I10 Essential (primary) hypertension: Secondary | ICD-10-CM | POA: Diagnosis not present

## 2021-09-30 DIAGNOSIS — E785 Hyperlipidemia, unspecified: Secondary | ICD-10-CM | POA: Diagnosis not present

## 2021-09-30 DIAGNOSIS — G43909 Migraine, unspecified, not intractable, without status migrainosus: Secondary | ICD-10-CM | POA: Diagnosis not present

## 2021-09-30 DIAGNOSIS — F419 Anxiety disorder, unspecified: Secondary | ICD-10-CM | POA: Diagnosis not present

## 2021-10-21 DIAGNOSIS — B3731 Acute candidiasis of vulva and vagina: Secondary | ICD-10-CM | POA: Diagnosis not present

## 2022-01-06 DIAGNOSIS — E039 Hypothyroidism, unspecified: Secondary | ICD-10-CM | POA: Diagnosis not present

## 2022-01-06 DIAGNOSIS — F419 Anxiety disorder, unspecified: Secondary | ICD-10-CM | POA: Diagnosis not present

## 2022-01-06 DIAGNOSIS — K219 Gastro-esophageal reflux disease without esophagitis: Secondary | ICD-10-CM | POA: Diagnosis not present

## 2022-01-06 DIAGNOSIS — I10 Essential (primary) hypertension: Secondary | ICD-10-CM | POA: Diagnosis not present

## 2022-01-06 DIAGNOSIS — J302 Other seasonal allergic rhinitis: Secondary | ICD-10-CM | POA: Diagnosis not present

## 2022-01-06 DIAGNOSIS — B3731 Acute candidiasis of vulva and vagina: Secondary | ICD-10-CM | POA: Diagnosis not present

## 2022-01-06 DIAGNOSIS — E119 Type 2 diabetes mellitus without complications: Secondary | ICD-10-CM | POA: Diagnosis not present

## 2022-01-06 DIAGNOSIS — E559 Vitamin D deficiency, unspecified: Secondary | ICD-10-CM | POA: Diagnosis not present

## 2022-01-06 DIAGNOSIS — G43909 Migraine, unspecified, not intractable, without status migrainosus: Secondary | ICD-10-CM | POA: Diagnosis not present

## 2022-01-06 DIAGNOSIS — E785 Hyperlipidemia, unspecified: Secondary | ICD-10-CM | POA: Diagnosis not present

## 2022-03-01 DIAGNOSIS — M10071 Idiopathic gout, right ankle and foot: Secondary | ICD-10-CM | POA: Diagnosis not present

## 2022-03-01 DIAGNOSIS — M109 Gout, unspecified: Secondary | ICD-10-CM | POA: Diagnosis not present

## 2022-03-01 DIAGNOSIS — M79674 Pain in right toe(s): Secondary | ICD-10-CM | POA: Diagnosis not present

## 2022-03-01 DIAGNOSIS — E876 Hypokalemia: Secondary | ICD-10-CM | POA: Diagnosis not present

## 2022-03-01 DIAGNOSIS — E119 Type 2 diabetes mellitus without complications: Secondary | ICD-10-CM | POA: Diagnosis not present

## 2022-03-01 DIAGNOSIS — M10072 Idiopathic gout, left ankle and foot: Secondary | ICD-10-CM | POA: Diagnosis not present

## 2022-03-02 DIAGNOSIS — F419 Anxiety disorder, unspecified: Secondary | ICD-10-CM | POA: Diagnosis not present

## 2022-03-02 DIAGNOSIS — E039 Hypothyroidism, unspecified: Secondary | ICD-10-CM | POA: Diagnosis not present

## 2022-03-02 DIAGNOSIS — I10 Essential (primary) hypertension: Secondary | ICD-10-CM | POA: Diagnosis not present

## 2022-03-02 DIAGNOSIS — E785 Hyperlipidemia, unspecified: Secondary | ICD-10-CM | POA: Diagnosis not present

## 2022-03-02 DIAGNOSIS — Z1231 Encounter for screening mammogram for malignant neoplasm of breast: Secondary | ICD-10-CM | POA: Diagnosis not present

## 2022-03-02 DIAGNOSIS — E559 Vitamin D deficiency, unspecified: Secondary | ICD-10-CM | POA: Diagnosis not present

## 2022-03-02 DIAGNOSIS — E876 Hypokalemia: Secondary | ICD-10-CM | POA: Diagnosis not present

## 2022-03-02 DIAGNOSIS — J302 Other seasonal allergic rhinitis: Secondary | ICD-10-CM | POA: Diagnosis not present

## 2022-03-02 DIAGNOSIS — E119 Type 2 diabetes mellitus without complications: Secondary | ICD-10-CM | POA: Diagnosis not present

## 2022-03-02 DIAGNOSIS — G43909 Migraine, unspecified, not intractable, without status migrainosus: Secondary | ICD-10-CM | POA: Diagnosis not present

## 2022-03-02 DIAGNOSIS — R9431 Abnormal electrocardiogram [ECG] [EKG]: Secondary | ICD-10-CM | POA: Diagnosis not present

## 2022-03-02 DIAGNOSIS — K219 Gastro-esophageal reflux disease without esophagitis: Secondary | ICD-10-CM | POA: Diagnosis not present

## 2022-03-09 DIAGNOSIS — J302 Other seasonal allergic rhinitis: Secondary | ICD-10-CM | POA: Diagnosis not present

## 2022-03-09 DIAGNOSIS — E039 Hypothyroidism, unspecified: Secondary | ICD-10-CM | POA: Diagnosis not present

## 2022-03-09 DIAGNOSIS — E876 Hypokalemia: Secondary | ICD-10-CM | POA: Diagnosis not present

## 2022-03-09 DIAGNOSIS — F419 Anxiety disorder, unspecified: Secondary | ICD-10-CM | POA: Diagnosis not present

## 2022-03-09 DIAGNOSIS — E785 Hyperlipidemia, unspecified: Secondary | ICD-10-CM | POA: Diagnosis not present

## 2022-03-09 DIAGNOSIS — K219 Gastro-esophageal reflux disease without esophagitis: Secondary | ICD-10-CM | POA: Diagnosis not present

## 2022-03-09 DIAGNOSIS — M10071 Idiopathic gout, right ankle and foot: Secondary | ICD-10-CM | POA: Diagnosis not present

## 2022-03-09 DIAGNOSIS — G43909 Migraine, unspecified, not intractable, without status migrainosus: Secondary | ICD-10-CM | POA: Diagnosis not present

## 2022-03-09 DIAGNOSIS — E119 Type 2 diabetes mellitus without complications: Secondary | ICD-10-CM | POA: Diagnosis not present

## 2022-03-09 DIAGNOSIS — I10 Essential (primary) hypertension: Secondary | ICD-10-CM | POA: Diagnosis not present

## 2022-03-09 DIAGNOSIS — E559 Vitamin D deficiency, unspecified: Secondary | ICD-10-CM | POA: Diagnosis not present

## 2022-03-23 DIAGNOSIS — M10071 Idiopathic gout, right ankle and foot: Secondary | ICD-10-CM | POA: Diagnosis not present

## 2022-03-24 DIAGNOSIS — I10 Essential (primary) hypertension: Secondary | ICD-10-CM | POA: Diagnosis not present

## 2022-03-24 DIAGNOSIS — F419 Anxiety disorder, unspecified: Secondary | ICD-10-CM | POA: Diagnosis not present

## 2022-03-24 DIAGNOSIS — G43909 Migraine, unspecified, not intractable, without status migrainosus: Secondary | ICD-10-CM | POA: Diagnosis not present

## 2022-03-24 DIAGNOSIS — E785 Hyperlipidemia, unspecified: Secondary | ICD-10-CM | POA: Diagnosis not present

## 2022-03-24 DIAGNOSIS — M10071 Idiopathic gout, right ankle and foot: Secondary | ICD-10-CM | POA: Diagnosis not present

## 2022-03-24 DIAGNOSIS — J0101 Acute recurrent maxillary sinusitis: Secondary | ICD-10-CM | POA: Diagnosis not present

## 2022-03-24 DIAGNOSIS — E119 Type 2 diabetes mellitus without complications: Secondary | ICD-10-CM | POA: Diagnosis not present

## 2022-03-24 DIAGNOSIS — J302 Other seasonal allergic rhinitis: Secondary | ICD-10-CM | POA: Diagnosis not present

## 2022-03-24 DIAGNOSIS — E559 Vitamin D deficiency, unspecified: Secondary | ICD-10-CM | POA: Diagnosis not present

## 2022-03-24 DIAGNOSIS — K219 Gastro-esophageal reflux disease without esophagitis: Secondary | ICD-10-CM | POA: Diagnosis not present

## 2022-03-24 DIAGNOSIS — E039 Hypothyroidism, unspecified: Secondary | ICD-10-CM | POA: Diagnosis not present

## 2022-04-06 DIAGNOSIS — M10071 Idiopathic gout, right ankle and foot: Secondary | ICD-10-CM | POA: Diagnosis not present

## 2022-05-05 DIAGNOSIS — Z23 Encounter for immunization: Secondary | ICD-10-CM | POA: Diagnosis not present

## 2022-05-20 DIAGNOSIS — F419 Anxiety disorder, unspecified: Secondary | ICD-10-CM | POA: Diagnosis not present

## 2022-05-20 DIAGNOSIS — M10072 Idiopathic gout, left ankle and foot: Secondary | ICD-10-CM | POA: Diagnosis not present

## 2022-06-03 DIAGNOSIS — K219 Gastro-esophageal reflux disease without esophagitis: Secondary | ICD-10-CM | POA: Diagnosis not present

## 2022-06-03 DIAGNOSIS — E039 Hypothyroidism, unspecified: Secondary | ICD-10-CM | POA: Diagnosis not present

## 2022-06-03 DIAGNOSIS — E119 Type 2 diabetes mellitus without complications: Secondary | ICD-10-CM | POA: Diagnosis not present

## 2022-06-03 DIAGNOSIS — E785 Hyperlipidemia, unspecified: Secondary | ICD-10-CM | POA: Diagnosis not present

## 2022-06-03 DIAGNOSIS — J302 Other seasonal allergic rhinitis: Secondary | ICD-10-CM | POA: Diagnosis not present

## 2022-06-03 DIAGNOSIS — G4709 Other insomnia: Secondary | ICD-10-CM | POA: Diagnosis not present

## 2022-06-03 DIAGNOSIS — E559 Vitamin D deficiency, unspecified: Secondary | ICD-10-CM | POA: Diagnosis not present

## 2022-06-03 DIAGNOSIS — I10 Essential (primary) hypertension: Secondary | ICD-10-CM | POA: Diagnosis not present

## 2022-06-03 DIAGNOSIS — G43909 Migraine, unspecified, not intractable, without status migrainosus: Secondary | ICD-10-CM | POA: Diagnosis not present

## 2022-06-24 DIAGNOSIS — E559 Vitamin D deficiency, unspecified: Secondary | ICD-10-CM | POA: Diagnosis not present

## 2022-06-24 DIAGNOSIS — J302 Other seasonal allergic rhinitis: Secondary | ICD-10-CM | POA: Diagnosis not present

## 2022-06-24 DIAGNOSIS — B3731 Acute candidiasis of vulva and vagina: Secondary | ICD-10-CM | POA: Diagnosis not present

## 2022-06-24 DIAGNOSIS — I10 Essential (primary) hypertension: Secondary | ICD-10-CM | POA: Diagnosis not present

## 2022-06-24 DIAGNOSIS — E039 Hypothyroidism, unspecified: Secondary | ICD-10-CM | POA: Diagnosis not present

## 2022-06-24 DIAGNOSIS — G43909 Migraine, unspecified, not intractable, without status migrainosus: Secondary | ICD-10-CM | POA: Diagnosis not present

## 2022-06-24 DIAGNOSIS — F419 Anxiety disorder, unspecified: Secondary | ICD-10-CM | POA: Diagnosis not present

## 2022-06-24 DIAGNOSIS — E785 Hyperlipidemia, unspecified: Secondary | ICD-10-CM | POA: Diagnosis not present

## 2022-06-24 DIAGNOSIS — K219 Gastro-esophageal reflux disease without esophagitis: Secondary | ICD-10-CM | POA: Diagnosis not present

## 2022-06-24 DIAGNOSIS — E119 Type 2 diabetes mellitus without complications: Secondary | ICD-10-CM | POA: Diagnosis not present

## 2022-06-24 DIAGNOSIS — G4709 Other insomnia: Secondary | ICD-10-CM | POA: Diagnosis not present

## 2022-07-22 DIAGNOSIS — G4709 Other insomnia: Secondary | ICD-10-CM | POA: Diagnosis not present

## 2022-07-22 DIAGNOSIS — J302 Other seasonal allergic rhinitis: Secondary | ICD-10-CM | POA: Diagnosis not present

## 2022-07-22 DIAGNOSIS — E785 Hyperlipidemia, unspecified: Secondary | ICD-10-CM | POA: Diagnosis not present

## 2022-07-22 DIAGNOSIS — E119 Type 2 diabetes mellitus without complications: Secondary | ICD-10-CM | POA: Diagnosis not present

## 2022-07-22 DIAGNOSIS — E559 Vitamin D deficiency, unspecified: Secondary | ICD-10-CM | POA: Diagnosis not present

## 2022-07-22 DIAGNOSIS — Z0001 Encounter for general adult medical examination with abnormal findings: Secondary | ICD-10-CM | POA: Diagnosis not present

## 2022-07-22 DIAGNOSIS — K219 Gastro-esophageal reflux disease without esophagitis: Secondary | ICD-10-CM | POA: Diagnosis not present

## 2022-07-22 DIAGNOSIS — E039 Hypothyroidism, unspecified: Secondary | ICD-10-CM | POA: Diagnosis not present

## 2022-07-22 DIAGNOSIS — F419 Anxiety disorder, unspecified: Secondary | ICD-10-CM | POA: Diagnosis not present

## 2022-07-22 DIAGNOSIS — G43909 Migraine, unspecified, not intractable, without status migrainosus: Secondary | ICD-10-CM | POA: Diagnosis not present

## 2022-07-22 DIAGNOSIS — I10 Essential (primary) hypertension: Secondary | ICD-10-CM | POA: Diagnosis not present

## 2022-08-12 DIAGNOSIS — K219 Gastro-esophageal reflux disease without esophagitis: Secondary | ICD-10-CM | POA: Diagnosis not present

## 2022-08-12 DIAGNOSIS — G4709 Other insomnia: Secondary | ICD-10-CM | POA: Diagnosis not present

## 2022-08-12 DIAGNOSIS — E559 Vitamin D deficiency, unspecified: Secondary | ICD-10-CM | POA: Diagnosis not present

## 2022-08-12 DIAGNOSIS — E039 Hypothyroidism, unspecified: Secondary | ICD-10-CM | POA: Diagnosis not present

## 2022-08-12 DIAGNOSIS — G43909 Migraine, unspecified, not intractable, without status migrainosus: Secondary | ICD-10-CM | POA: Diagnosis not present

## 2022-08-12 DIAGNOSIS — J302 Other seasonal allergic rhinitis: Secondary | ICD-10-CM | POA: Diagnosis not present

## 2022-08-12 DIAGNOSIS — E785 Hyperlipidemia, unspecified: Secondary | ICD-10-CM | POA: Diagnosis not present

## 2022-08-12 DIAGNOSIS — E119 Type 2 diabetes mellitus without complications: Secondary | ICD-10-CM | POA: Diagnosis not present

## 2022-08-12 DIAGNOSIS — F419 Anxiety disorder, unspecified: Secondary | ICD-10-CM | POA: Diagnosis not present

## 2022-08-12 DIAGNOSIS — I10 Essential (primary) hypertension: Secondary | ICD-10-CM | POA: Diagnosis not present

## 2022-09-02 DIAGNOSIS — H43393 Other vitreous opacities, bilateral: Secondary | ICD-10-CM | POA: Diagnosis not present

## 2022-09-02 DIAGNOSIS — H52203 Unspecified astigmatism, bilateral: Secondary | ICD-10-CM | POA: Diagnosis not present

## 2022-09-02 DIAGNOSIS — H04123 Dry eye syndrome of bilateral lacrimal glands: Secondary | ICD-10-CM | POA: Diagnosis not present

## 2022-09-02 DIAGNOSIS — H5203 Hypermetropia, bilateral: Secondary | ICD-10-CM | POA: Diagnosis not present

## 2022-09-02 DIAGNOSIS — H524 Presbyopia: Secondary | ICD-10-CM | POA: Diagnosis not present

## 2022-09-02 DIAGNOSIS — H17822 Peripheral opacity of cornea, left eye: Secondary | ICD-10-CM | POA: Diagnosis not present

## 2022-09-02 DIAGNOSIS — H2513 Age-related nuclear cataract, bilateral: Secondary | ICD-10-CM | POA: Diagnosis not present

## 2022-09-02 DIAGNOSIS — E119 Type 2 diabetes mellitus without complications: Secondary | ICD-10-CM | POA: Diagnosis not present

## 2022-10-14 DIAGNOSIS — E119 Type 2 diabetes mellitus without complications: Secondary | ICD-10-CM | POA: Diagnosis not present

## 2022-10-14 DIAGNOSIS — E039 Hypothyroidism, unspecified: Secondary | ICD-10-CM | POA: Diagnosis not present

## 2022-10-14 DIAGNOSIS — E559 Vitamin D deficiency, unspecified: Secondary | ICD-10-CM | POA: Diagnosis not present

## 2022-10-14 DIAGNOSIS — E785 Hyperlipidemia, unspecified: Secondary | ICD-10-CM | POA: Diagnosis not present

## 2022-10-14 DIAGNOSIS — F419 Anxiety disorder, unspecified: Secondary | ICD-10-CM | POA: Diagnosis not present

## 2022-10-14 DIAGNOSIS — G43909 Migraine, unspecified, not intractable, without status migrainosus: Secondary | ICD-10-CM | POA: Diagnosis not present

## 2022-10-14 DIAGNOSIS — I10 Essential (primary) hypertension: Secondary | ICD-10-CM | POA: Diagnosis not present

## 2022-10-14 DIAGNOSIS — G4709 Other insomnia: Secondary | ICD-10-CM | POA: Diagnosis not present

## 2022-10-14 DIAGNOSIS — K219 Gastro-esophageal reflux disease without esophagitis: Secondary | ICD-10-CM | POA: Diagnosis not present

## 2022-10-14 DIAGNOSIS — J302 Other seasonal allergic rhinitis: Secondary | ICD-10-CM | POA: Diagnosis not present

## 2022-11-02 DIAGNOSIS — M778 Other enthesopathies, not elsewhere classified: Secondary | ICD-10-CM | POA: Diagnosis not present

## 2022-11-02 DIAGNOSIS — M79672 Pain in left foot: Secondary | ICD-10-CM | POA: Diagnosis not present

## 2022-11-02 DIAGNOSIS — M722 Plantar fascial fibromatosis: Secondary | ICD-10-CM | POA: Diagnosis not present

## 2022-11-20 DIAGNOSIS — S99921A Unspecified injury of right foot, initial encounter: Secondary | ICD-10-CM | POA: Diagnosis not present

## 2022-11-20 DIAGNOSIS — S92511A Displaced fracture of proximal phalanx of right lesser toe(s), initial encounter for closed fracture: Secondary | ICD-10-CM | POA: Diagnosis not present

## 2022-11-25 DIAGNOSIS — M722 Plantar fascial fibromatosis: Secondary | ICD-10-CM | POA: Diagnosis not present

## 2022-11-25 DIAGNOSIS — S92501D Displaced unspecified fracture of right lesser toe(s), subsequent encounter for fracture with routine healing: Secondary | ICD-10-CM | POA: Diagnosis not present

## 2022-11-25 DIAGNOSIS — M6702 Short Achilles tendon (acquired), left ankle: Secondary | ICD-10-CM | POA: Diagnosis not present

## 2022-12-16 DIAGNOSIS — S92501D Displaced unspecified fracture of right lesser toe(s), subsequent encounter for fracture with routine healing: Secondary | ICD-10-CM | POA: Diagnosis not present

## 2022-12-30 DIAGNOSIS — K219 Gastro-esophageal reflux disease without esophagitis: Secondary | ICD-10-CM | POA: Diagnosis not present

## 2022-12-30 DIAGNOSIS — Z5181 Encounter for therapeutic drug level monitoring: Secondary | ICD-10-CM | POA: Diagnosis not present

## 2022-12-30 DIAGNOSIS — G43909 Migraine, unspecified, not intractable, without status migrainosus: Secondary | ICD-10-CM | POA: Diagnosis not present

## 2022-12-30 DIAGNOSIS — G4709 Other insomnia: Secondary | ICD-10-CM | POA: Diagnosis not present

## 2022-12-30 DIAGNOSIS — F419 Anxiety disorder, unspecified: Secondary | ICD-10-CM | POA: Diagnosis not present

## 2022-12-30 DIAGNOSIS — E039 Hypothyroidism, unspecified: Secondary | ICD-10-CM | POA: Diagnosis not present

## 2022-12-30 DIAGNOSIS — E785 Hyperlipidemia, unspecified: Secondary | ICD-10-CM | POA: Diagnosis not present

## 2022-12-30 DIAGNOSIS — J302 Other seasonal allergic rhinitis: Secondary | ICD-10-CM | POA: Diagnosis not present

## 2022-12-30 DIAGNOSIS — E559 Vitamin D deficiency, unspecified: Secondary | ICD-10-CM | POA: Diagnosis not present

## 2022-12-30 DIAGNOSIS — I10 Essential (primary) hypertension: Secondary | ICD-10-CM | POA: Diagnosis not present

## 2022-12-30 DIAGNOSIS — E119 Type 2 diabetes mellitus without complications: Secondary | ICD-10-CM | POA: Diagnosis not present

## 2023-01-03 ENCOUNTER — Other Ambulatory Visit: Payer: Self-pay

## 2023-01-03 ENCOUNTER — Emergency Department (HOSPITAL_BASED_OUTPATIENT_CLINIC_OR_DEPARTMENT_OTHER)
Admission: EM | Admit: 2023-01-03 | Discharge: 2023-01-03 | Disposition: A | Discharge status: Home / Self Care | Payer: Medicare Other | Attending: Emergency Medicine | Admitting: Emergency Medicine

## 2023-01-03 ENCOUNTER — Encounter (HOSPITAL_BASED_OUTPATIENT_CLINIC_OR_DEPARTMENT_OTHER): Payer: Self-pay

## 2023-01-03 DIAGNOSIS — Z8673 Personal history of transient ischemic attack (TIA), and cerebral infarction without residual deficits: Secondary | ICD-10-CM | POA: Insufficient documentation

## 2023-01-03 DIAGNOSIS — I10 Essential (primary) hypertension: Secondary | ICD-10-CM | POA: Insufficient documentation

## 2023-01-03 DIAGNOSIS — M5442 Lumbago with sciatica, left side: Secondary | ICD-10-CM | POA: Insufficient documentation

## 2023-01-03 DIAGNOSIS — M545 Low back pain, unspecified: Secondary | ICD-10-CM | POA: Diagnosis present

## 2023-01-03 MED ORDER — CYCLOBENZAPRINE HCL 10 MG PO TABS: 10.0000 mg | ORAL_TABLET | Freq: Two times a day (BID) | ORAL | 0 refills | Status: AC | PRN

## 2023-01-03 MED ORDER — OXYCODONE-ACETAMINOPHEN 5-325 MG PO TABS
1.0000 | ORAL_TABLET | Freq: Once | ORAL | Status: AC
  Administered 2023-01-03: 1 via ORAL
  Filled 2023-01-03: qty 1

## 2023-01-03 MED ORDER — IBUPROFEN 800 MG PO TABS
800.0000 mg | ORAL_TABLET | Freq: Once | ORAL | Status: AC
  Administered 2023-01-03: 800 mg via ORAL
  Filled 2023-01-03: qty 1

## 2023-01-03 MED ORDER — LIDOCAINE 5 % EX PTCH
2.0000 | MEDICATED_PATCH | CUTANEOUS | Status: DC
  Administered 2023-01-03: 2 via TRANSDERMAL
  Filled 2023-01-03: qty 2

## 2023-01-03 MED ORDER — PREDNISONE 10 MG (21) PO TBPK: ORAL_TABLET | Freq: Every day | ORAL | 0 refills | Status: AC

## 2023-01-03 MED ORDER — OXYCODONE-ACETAMINOPHEN 5-325 MG PO TABS: 1.0000 | ORAL_TABLET | Freq: Four times a day (QID) | ORAL | 0 refills | Status: AC | PRN

## 2023-01-03 NOTE — ED Provider Notes (Signed)
[place] EMERGENCY DEPARTMENT AT [facility] Provider Note   CSN: [id number] Arrival date & time: 01/03/23  8657     History  Chief Complaint  Patient presents with   Back Pain    Tiffany Larsen is a 62 y.o. female with a past medical history of CVA, GERD, hypertension presents today for evaluation of back pain.  Patient went to [facility] last week when she states that she slept on an uncomfortable bed that when her back started to hurt.  The pain is across her lower back, radiating to her left groin and left leg.  States it got worse if she bent forward, turning in bed or changing position.  History of sciatica but she has not had it in a long time.  She has tried meloxicam, Tylenol, muscle relaxant with no relief.  She denies urinary symptoms, urinary retention, saddle anesthesia, distal weakness.  Denies any fever, IVDU, chest pain, shortness of breath, blood in her stool or urine.   Back Pain     Past Medical History:  Diagnosis Date   Arthritis    Back pain    CVA (cerebral infarction)    GERD (gastroesophageal reflux disease)    Hypercholesteremia    Hypertension    Mild intermittent asthma 08/04/2017   Sleep apnea    Stroke [facility])    Thyroid disease    Urticaria    Past Surgical History:  Procedure Laterality Date   ABDOMINAL HYSTERECTOMY     CARPAL TUNNEL RELEASE     KNEE SURGERY     NASAL SINUS SURGERY     NASAL SINUS SURGERY       Home Medications Prior to Admission medications   Medication Sig Start Date End Date Taking? Authorizing Provider  albuterol (PROVENTIL,VENTOLIN) 90 MCG/ACT inhaler Inhale 2 puffs into the lungs every 6 (six) hours as needed. For shortness of breath    [provider]  ALPRAZolam (XANAX) 0.5 MG tablet Take 0.5 mg by mouth 3 (three) times daily. 05/23/19   [provider]  amoxicillin-clavulanate (AUGMENTIN) 875-125 MG tablet Take 1 tablet by mouth every 12 (twelve) hours. 06/22/19   Terrilee Files,  MD  atenolol-chlorthalidone (TENORETIC) 50-25 MG per tablet Take 1 tablet by mouth daily.      [provider]  atorvastatin (LIPITOR) 80 MG tablet Take 80 mg by mouth daily.    [provider]  baclofen (LIORESAL) 10 MG tablet Take 10 mg by mouth 2 (two) times daily as needed. 06/05/17   [provider]  cyclobenzaprine (FLEXERIL) 5 MG tablet  06/20/19   [provider]  diphenhydrAMINE (BENADRYL) 25 MG tablet Take 50 mg by mouth every 6 (six) hours as needed. For allergies    [provider]  fluticasone (FLONASE) 50 MCG/ACT nasal spray Place 2 sprays into both nostrils daily. 03/19/18   Pricilla Loveless, MD  folic acid (FOLVITE) 1 MG tablet Take 1 mg by mouth daily. 05/16/19   [provider]  levothyroxine (SYNTHROID, LEVOTHROID) 100 MCG tablet Take 100 mcg by mouth daily.    [provider]  meloxicam (MOBIC) 7.5 MG tablet Take 7.5 mg by mouth daily. 04/12/19   [provider]  NATURAL VITAMIN D-3 125 MCG (5000 UT) TABS Take 1 tablet by mouth daily. 04/17/19   [provider]  omeprazole (PRILOSEC) 40 MG capsule Take 40 mg by mouth daily. 05/08/19   [provider]  potassium chloride SA (K-DUR,KLOR-CON) 20 MEQ tablet Take 20  mEq by mouth daily.      [provider]  vitamin B-12 (CYANOCOBALAMIN) 100 MCG tablet Take 100 mcg by mouth daily. 05/25/19   [provider]      Allergies    Shellfish allergy    Review of Systems   Review of Systems  Musculoskeletal:  Positive for back pain.    Physical Exam Updated Vital Signs BP (!) 151/68 (BP Location: Right Arm)   Pulse (!) 59   Temp 98.3 F (36.8 C) (Oral)   Resp 18   Ht 5' 2.5" (1.588 m)   Wt 86.2 kg   SpO2 99%   BMI 34.20 kg/m  Physical Exam Vitals and nursing note reviewed.  Constitutional:      Appearance: Normal appearance.  HENT:     Head: Normocephalic and atraumatic.     Mouth/Throat:     Mouth: Mucous membranes  are moist.  Eyes:     General: No scleral icterus. Cardiovascular:     Rate and Rhythm: Normal rate and regular rhythm.     Pulses: Normal pulses.     Heart sounds: Normal heart sounds.  Pulmonary:     Effort: Pulmonary effort is normal.     Breath sounds: Normal breath sounds.  Abdominal:     General: Abdomen is flat.     Palpations: Abdomen is soft.     Tenderness: There is no abdominal tenderness.  Musculoskeletal:        General: No deformity.     Comments: Tenderness to palpation to paraspinal muscles of the lumbar spine.  Positive straight leg raise.  Skin:    General: Skin is warm.     Findings: No rash.  Neurological:     General: No focal deficit present.     Mental Status: She is alert.  Psychiatric:        Mood and Affect: Mood normal.     ED Results / Procedures / Treatments   Labs (all labs ordered are listed, but only abnormal results are displayed) Labs Reviewed - No data to display  EKG None  Radiology No results found.  Procedures Procedures    Medications Ordered in ED Medications - No data to display  ED Course/ Medical Decision Making/ A&P                             Medical Decision Making Risk Prescription drug management.   This patient presents to the ED for lower back pain, this involves an extensive number of treatment options, and is a complaint that carries with a high risk of complications and morbidity.  The differential diagnosis includes sciatica, neuropathy, cauda equina, musculoskeletal pain, fracture, dislocation.  This is not an exhaustive list.  Problem list/ ED course/ Critical interventions/ Medical management: HPI: See above Vital signs within normal range and stable throughout visit. Laboratory/imaging studies significant for: See above. On physical examination, patient is afebrile and appears in no acute distress.  This patient presents with back pain most consistent with musculoskeletal spasms versus sciatica.   More likely sciatica as straight leg raise test was positive.  No back pain red flags on history or physical.  Presentation not consistent with fracture, patient has no trauma or bony tenderness to palpation.  Unlikely cauda equina, patient has no bowel or urinary incontinence/retention, no saddle anesthesia, no distal weakness.  Unlikely kidney stones, pyelonephritis or UTI, patient is afebrile, has no CVA tenderness or urinary symptoms.  Given clinical picture, no indication for imaging at this time.  Given Percocet for pain.  Reevaluation of patient after this medication showed that the patient improved. I will send an Rx of Flexeril and taper dose steroid and a short course of Percocet.  Advised patient to follow-up with her primary care physician for further evaluation management.  Strict return precaution discussed. I have reviewed the patient home medicines and have made adjustments as needed.  Cardiac monitoring/EKG: The patient was maintained on a cardiac monitor.  I personally reviewed and interpreted the cardiac monitor which showed an underlying rhythm of: sinus rhythm.  Additional history obtained: External records from outside source obtained and reviewed including: Chart review including previous notes, labs, imaging.  Consultations obtained:  Disposition Continued outpatient therapy. Follow-up with PCP recommended for reevaluation of symptoms. Treatment plan discussed with patient.  Pt acknowledged understanding was agreeable to the plan. Worrisome signs and symptoms were discussed with patient, and patient acknowledged understanding to return to the ED if they noticed these signs and symptoms. Patient was stable upon discharge.   This chart was dictated using voice recognition software.  Despite best efforts to proofread,  errors can occur which can change the documentation meaning.          Final Clinical Impression(s) / ED Diagnoses Final diagnoses:  Acute left-sided low  back pain with left-sided sciatica    Rx / DC Orders ED Discharge Orders          Ordered    cyclobenzaprine (FLEXERIL) 10 MG tablet  2 times daily PRN        01/03/23 1006    oxyCODONE-acetaminophen (PERCOCET/ROXICET) 5-325 MG tablet  Every 6 hours PRN        01/03/23 1006    predniSONE (STERAPRED UNI-PAK 21 TAB) 10 MG (21) TBPK tablet  Daily        01/03/23 1006              Jeanelle Malling, [place] 01/03/23 1011    Rolan Bucco, MD 01/03/23 1356

## 2023-01-03 NOTE — ED Triage Notes (Signed)
C/o lumbar back pain x 1 week, worsening since yesterday, radiating to left groin. Hx of sciatica, feels the same.

## 2023-01-03 NOTE — Discharge Instructions (Addendum)
Please take your medications as prescribed. Take tylenol/ibuprofen every 6 hours or Percocet as needed for pain. I recommend close follow-up with PCP for reevaluation.  Please do not hesitate to return to emergency department if worrisome signs symptoms we discussed become apparent.

## 2023-03-16 DIAGNOSIS — Z1231 Encounter for screening mammogram for malignant neoplasm of breast: Secondary | ICD-10-CM | POA: Diagnosis not present

## 2023-03-17 DIAGNOSIS — E785 Hyperlipidemia, unspecified: Secondary | ICD-10-CM | POA: Diagnosis not present

## 2023-03-17 DIAGNOSIS — J302 Other seasonal allergic rhinitis: Secondary | ICD-10-CM | POA: Diagnosis not present

## 2023-03-17 DIAGNOSIS — I10 Essential (primary) hypertension: Secondary | ICD-10-CM | POA: Diagnosis not present

## 2023-03-17 DIAGNOSIS — Z5181 Encounter for therapeutic drug level monitoring: Secondary | ICD-10-CM | POA: Diagnosis not present

## 2023-03-17 DIAGNOSIS — E119 Type 2 diabetes mellitus without complications: Secondary | ICD-10-CM | POA: Diagnosis not present

## 2023-03-17 DIAGNOSIS — G43909 Migraine, unspecified, not intractable, without status migrainosus: Secondary | ICD-10-CM | POA: Diagnosis not present

## 2023-03-17 DIAGNOSIS — E559 Vitamin D deficiency, unspecified: Secondary | ICD-10-CM | POA: Diagnosis not present

## 2023-03-17 DIAGNOSIS — F419 Anxiety disorder, unspecified: Secondary | ICD-10-CM | POA: Diagnosis not present

## 2023-03-17 DIAGNOSIS — K219 Gastro-esophageal reflux disease without esophagitis: Secondary | ICD-10-CM | POA: Diagnosis not present

## 2023-03-17 DIAGNOSIS — G4709 Other insomnia: Secondary | ICD-10-CM | POA: Diagnosis not present

## 2023-03-17 DIAGNOSIS — E039 Hypothyroidism, unspecified: Secondary | ICD-10-CM | POA: Diagnosis not present

## 2023-03-25 DIAGNOSIS — Z23 Encounter for immunization: Secondary | ICD-10-CM | POA: Diagnosis not present

## 2023-05-06 DIAGNOSIS — B372 Candidiasis of skin and nail: Secondary | ICD-10-CM | POA: Diagnosis not present

## 2023-05-18 DIAGNOSIS — M62838 Other muscle spasm: Secondary | ICD-10-CM | POA: Diagnosis not present

## 2023-05-18 DIAGNOSIS — M542 Cervicalgia: Secondary | ICD-10-CM | POA: Diagnosis not present

## 2023-05-22 DIAGNOSIS — R079 Chest pain, unspecified: Secondary | ICD-10-CM | POA: Diagnosis not present

## 2023-05-22 DIAGNOSIS — Z79899 Other long term (current) drug therapy: Secondary | ICD-10-CM | POA: Diagnosis not present

## 2023-05-22 DIAGNOSIS — E039 Hypothyroidism, unspecified: Secondary | ICD-10-CM | POA: Diagnosis not present

## 2023-05-22 DIAGNOSIS — R0789 Other chest pain: Secondary | ICD-10-CM | POA: Diagnosis not present

## 2023-05-22 DIAGNOSIS — R7989 Other specified abnormal findings of blood chemistry: Secondary | ICD-10-CM | POA: Diagnosis not present

## 2023-05-22 DIAGNOSIS — Z791 Long term (current) use of non-steroidal anti-inflammatories (NSAID): Secondary | ICD-10-CM | POA: Diagnosis not present

## 2023-05-22 DIAGNOSIS — E876 Hypokalemia: Secondary | ICD-10-CM | POA: Diagnosis not present

## 2023-05-22 DIAGNOSIS — R000 Tachycardia, unspecified: Secondary | ICD-10-CM | POA: Diagnosis not present

## 2023-05-22 DIAGNOSIS — I10 Essential (primary) hypertension: Secondary | ICD-10-CM | POA: Diagnosis not present

## 2023-05-22 DIAGNOSIS — K219 Gastro-esophageal reflux disease without esophagitis: Secondary | ICD-10-CM | POA: Diagnosis not present

## 2023-05-22 DIAGNOSIS — F419 Anxiety disorder, unspecified: Secondary | ICD-10-CM | POA: Diagnosis not present

## 2023-05-22 DIAGNOSIS — R778 Other specified abnormalities of plasma proteins: Secondary | ICD-10-CM | POA: Diagnosis not present

## 2023-05-22 DIAGNOSIS — E119 Type 2 diabetes mellitus without complications: Secondary | ICD-10-CM | POA: Diagnosis not present

## 2023-05-22 DIAGNOSIS — R002 Palpitations: Secondary | ICD-10-CM | POA: Diagnosis not present

## 2023-05-22 DIAGNOSIS — Z79890 Hormone replacement therapy (postmenopausal): Secondary | ICD-10-CM | POA: Diagnosis not present

## 2023-05-23 DIAGNOSIS — R072 Precordial pain: Secondary | ICD-10-CM | POA: Diagnosis not present

## 2023-05-24 DIAGNOSIS — E876 Hypokalemia: Secondary | ICD-10-CM | POA: Diagnosis not present

## 2023-05-24 DIAGNOSIS — I493 Ventricular premature depolarization: Secondary | ICD-10-CM | POA: Diagnosis not present

## 2023-05-24 DIAGNOSIS — I499 Cardiac arrhythmia, unspecified: Secondary | ICD-10-CM | POA: Diagnosis not present

## 2023-05-24 DIAGNOSIS — R002 Palpitations: Secondary | ICD-10-CM | POA: Diagnosis not present

## 2023-05-24 DIAGNOSIS — I517 Cardiomegaly: Secondary | ICD-10-CM | POA: Diagnosis not present

## 2023-05-26 DIAGNOSIS — G4709 Other insomnia: Secondary | ICD-10-CM | POA: Diagnosis not present

## 2023-05-26 DIAGNOSIS — E039 Hypothyroidism, unspecified: Secondary | ICD-10-CM | POA: Diagnosis not present

## 2023-05-26 DIAGNOSIS — J302 Other seasonal allergic rhinitis: Secondary | ICD-10-CM | POA: Diagnosis not present

## 2023-05-26 DIAGNOSIS — G43909 Migraine, unspecified, not intractable, without status migrainosus: Secondary | ICD-10-CM | POA: Diagnosis not present

## 2023-05-26 DIAGNOSIS — E785 Hyperlipidemia, unspecified: Secondary | ICD-10-CM | POA: Diagnosis not present

## 2023-05-26 DIAGNOSIS — E119 Type 2 diabetes mellitus without complications: Secondary | ICD-10-CM | POA: Diagnosis not present

## 2023-05-26 DIAGNOSIS — E559 Vitamin D deficiency, unspecified: Secondary | ICD-10-CM | POA: Diagnosis not present

## 2023-05-26 DIAGNOSIS — F419 Anxiety disorder, unspecified: Secondary | ICD-10-CM | POA: Diagnosis not present

## 2023-05-26 DIAGNOSIS — K219 Gastro-esophageal reflux disease without esophagitis: Secondary | ICD-10-CM | POA: Diagnosis not present

## 2023-05-26 DIAGNOSIS — I10 Essential (primary) hypertension: Secondary | ICD-10-CM | POA: Diagnosis not present

## 2023-06-02 DIAGNOSIS — N289 Disorder of kidney and ureter, unspecified: Secondary | ICD-10-CM | POA: Diagnosis not present

## 2023-06-02 DIAGNOSIS — J302 Other seasonal allergic rhinitis: Secondary | ICD-10-CM | POA: Diagnosis not present

## 2023-06-02 DIAGNOSIS — E559 Vitamin D deficiency, unspecified: Secondary | ICD-10-CM | POA: Diagnosis not present

## 2023-06-02 DIAGNOSIS — E039 Hypothyroidism, unspecified: Secondary | ICD-10-CM | POA: Diagnosis not present

## 2023-06-02 DIAGNOSIS — K219 Gastro-esophageal reflux disease without esophagitis: Secondary | ICD-10-CM | POA: Diagnosis not present

## 2023-06-02 DIAGNOSIS — G43909 Migraine, unspecified, not intractable, without status migrainosus: Secondary | ICD-10-CM | POA: Diagnosis not present

## 2023-06-02 DIAGNOSIS — E785 Hyperlipidemia, unspecified: Secondary | ICD-10-CM | POA: Diagnosis not present

## 2023-06-02 DIAGNOSIS — I10 Essential (primary) hypertension: Secondary | ICD-10-CM | POA: Diagnosis not present

## 2023-06-02 DIAGNOSIS — E119 Type 2 diabetes mellitus without complications: Secondary | ICD-10-CM | POA: Diagnosis not present

## 2023-06-02 DIAGNOSIS — G4709 Other insomnia: Secondary | ICD-10-CM | POA: Diagnosis not present

## 2023-06-02 DIAGNOSIS — F419 Anxiety disorder, unspecified: Secondary | ICD-10-CM | POA: Diagnosis not present

## 2023-06-04 DIAGNOSIS — I10 Essential (primary) hypertension: Secondary | ICD-10-CM | POA: Diagnosis not present

## 2023-06-04 DIAGNOSIS — I160 Hypertensive urgency: Secondary | ICD-10-CM | POA: Diagnosis not present

## 2023-06-07 DIAGNOSIS — I10 Essential (primary) hypertension: Secondary | ICD-10-CM | POA: Diagnosis not present

## 2023-06-07 DIAGNOSIS — R002 Palpitations: Secondary | ICD-10-CM | POA: Diagnosis not present

## 2023-06-07 DIAGNOSIS — R072 Precordial pain: Secondary | ICD-10-CM | POA: Diagnosis not present

## 2023-06-07 DIAGNOSIS — E119 Type 2 diabetes mellitus without complications: Secondary | ICD-10-CM | POA: Diagnosis not present

## 2023-06-07 DIAGNOSIS — E782 Mixed hyperlipidemia: Secondary | ICD-10-CM | POA: Diagnosis not present

## 2023-06-15 DIAGNOSIS — E119 Type 2 diabetes mellitus without complications: Secondary | ICD-10-CM | POA: Diagnosis not present

## 2023-06-16 DIAGNOSIS — F419 Anxiety disorder, unspecified: Secondary | ICD-10-CM | POA: Diagnosis not present

## 2023-06-16 DIAGNOSIS — J302 Other seasonal allergic rhinitis: Secondary | ICD-10-CM | POA: Diagnosis not present

## 2023-06-16 DIAGNOSIS — E785 Hyperlipidemia, unspecified: Secondary | ICD-10-CM | POA: Diagnosis not present

## 2023-06-16 DIAGNOSIS — G43909 Migraine, unspecified, not intractable, without status migrainosus: Secondary | ICD-10-CM | POA: Diagnosis not present

## 2023-06-16 DIAGNOSIS — I10 Essential (primary) hypertension: Secondary | ICD-10-CM | POA: Diagnosis not present

## 2023-06-16 DIAGNOSIS — E039 Hypothyroidism, unspecified: Secondary | ICD-10-CM | POA: Diagnosis not present

## 2023-06-16 DIAGNOSIS — E559 Vitamin D deficiency, unspecified: Secondary | ICD-10-CM | POA: Diagnosis not present

## 2023-06-16 DIAGNOSIS — G4709 Other insomnia: Secondary | ICD-10-CM | POA: Diagnosis not present

## 2023-06-16 DIAGNOSIS — E119 Type 2 diabetes mellitus without complications: Secondary | ICD-10-CM | POA: Diagnosis not present

## 2023-06-16 DIAGNOSIS — K219 Gastro-esophageal reflux disease without esophagitis: Secondary | ICD-10-CM | POA: Diagnosis not present

## 2023-06-27 DIAGNOSIS — E119 Type 2 diabetes mellitus without complications: Secondary | ICD-10-CM | POA: Diagnosis not present

## 2023-06-27 DIAGNOSIS — R002 Palpitations: Secondary | ICD-10-CM | POA: Diagnosis not present

## 2023-06-29 DIAGNOSIS — E119 Type 2 diabetes mellitus without complications: Secondary | ICD-10-CM | POA: Diagnosis not present

## 2023-07-02 DIAGNOSIS — Z23 Encounter for immunization: Secondary | ICD-10-CM | POA: Diagnosis not present

## 2023-07-14 DIAGNOSIS — G43909 Migraine, unspecified, not intractable, without status migrainosus: Secondary | ICD-10-CM | POA: Diagnosis not present

## 2023-07-14 DIAGNOSIS — M10071 Idiopathic gout, right ankle and foot: Secondary | ICD-10-CM | POA: Diagnosis not present

## 2023-07-14 DIAGNOSIS — K219 Gastro-esophageal reflux disease without esophagitis: Secondary | ICD-10-CM | POA: Diagnosis not present

## 2023-07-14 DIAGNOSIS — G4709 Other insomnia: Secondary | ICD-10-CM | POA: Diagnosis not present

## 2023-07-14 DIAGNOSIS — N289 Disorder of kidney and ureter, unspecified: Secondary | ICD-10-CM | POA: Diagnosis not present

## 2023-07-14 DIAGNOSIS — I10 Essential (primary) hypertension: Secondary | ICD-10-CM | POA: Diagnosis not present

## 2023-07-14 DIAGNOSIS — E559 Vitamin D deficiency, unspecified: Secondary | ICD-10-CM | POA: Diagnosis not present

## 2023-07-14 DIAGNOSIS — J302 Other seasonal allergic rhinitis: Secondary | ICD-10-CM | POA: Diagnosis not present

## 2023-07-14 DIAGNOSIS — E785 Hyperlipidemia, unspecified: Secondary | ICD-10-CM | POA: Diagnosis not present

## 2023-07-14 DIAGNOSIS — F419 Anxiety disorder, unspecified: Secondary | ICD-10-CM | POA: Diagnosis not present

## 2023-07-14 DIAGNOSIS — E119 Type 2 diabetes mellitus without complications: Secondary | ICD-10-CM | POA: Diagnosis not present

## 2023-07-14 DIAGNOSIS — E039 Hypothyroidism, unspecified: Secondary | ICD-10-CM | POA: Diagnosis not present

## 2023-08-04 DIAGNOSIS — I10 Essential (primary) hypertension: Secondary | ICD-10-CM | POA: Diagnosis not present

## 2023-08-04 DIAGNOSIS — E039 Hypothyroidism, unspecified: Secondary | ICD-10-CM | POA: Diagnosis not present

## 2023-08-04 DIAGNOSIS — J302 Other seasonal allergic rhinitis: Secondary | ICD-10-CM | POA: Diagnosis not present

## 2023-08-04 DIAGNOSIS — G4709 Other insomnia: Secondary | ICD-10-CM | POA: Diagnosis not present

## 2023-08-04 DIAGNOSIS — N289 Disorder of kidney and ureter, unspecified: Secondary | ICD-10-CM | POA: Diagnosis not present

## 2023-08-04 DIAGNOSIS — G43909 Migraine, unspecified, not intractable, without status migrainosus: Secondary | ICD-10-CM | POA: Diagnosis not present

## 2023-08-04 DIAGNOSIS — K219 Gastro-esophageal reflux disease without esophagitis: Secondary | ICD-10-CM | POA: Diagnosis not present

## 2023-08-04 DIAGNOSIS — E119 Type 2 diabetes mellitus without complications: Secondary | ICD-10-CM | POA: Diagnosis not present

## 2023-08-04 DIAGNOSIS — Z0001 Encounter for general adult medical examination with abnormal findings: Secondary | ICD-10-CM | POA: Diagnosis not present

## 2023-08-04 DIAGNOSIS — M10071 Idiopathic gout, right ankle and foot: Secondary | ICD-10-CM | POA: Diagnosis not present

## 2023-08-04 DIAGNOSIS — E559 Vitamin D deficiency, unspecified: Secondary | ICD-10-CM | POA: Diagnosis not present

## 2023-08-04 DIAGNOSIS — E785 Hyperlipidemia, unspecified: Secondary | ICD-10-CM | POA: Diagnosis not present

## 2023-08-18 DIAGNOSIS — E785 Hyperlipidemia, unspecified: Secondary | ICD-10-CM | POA: Diagnosis not present

## 2023-08-18 DIAGNOSIS — K219 Gastro-esophageal reflux disease without esophagitis: Secondary | ICD-10-CM | POA: Diagnosis not present

## 2023-08-18 DIAGNOSIS — E119 Type 2 diabetes mellitus without complications: Secondary | ICD-10-CM | POA: Diagnosis not present

## 2023-08-18 DIAGNOSIS — F419 Anxiety disorder, unspecified: Secondary | ICD-10-CM | POA: Diagnosis not present

## 2023-08-18 DIAGNOSIS — M10071 Idiopathic gout, right ankle and foot: Secondary | ICD-10-CM | POA: Diagnosis not present

## 2023-08-18 DIAGNOSIS — J302 Other seasonal allergic rhinitis: Secondary | ICD-10-CM | POA: Diagnosis not present

## 2023-08-18 DIAGNOSIS — E559 Vitamin D deficiency, unspecified: Secondary | ICD-10-CM | POA: Diagnosis not present

## 2023-08-18 DIAGNOSIS — G43909 Migraine, unspecified, not intractable, without status migrainosus: Secondary | ICD-10-CM | POA: Diagnosis not present

## 2023-08-18 DIAGNOSIS — I10 Essential (primary) hypertension: Secondary | ICD-10-CM | POA: Diagnosis not present

## 2023-08-18 DIAGNOSIS — G4709 Other insomnia: Secondary | ICD-10-CM | POA: Diagnosis not present

## 2023-08-18 DIAGNOSIS — E039 Hypothyroidism, unspecified: Secondary | ICD-10-CM | POA: Diagnosis not present

## 2023-09-06 ENCOUNTER — Emergency Department (HOSPITAL_BASED_OUTPATIENT_CLINIC_OR_DEPARTMENT_OTHER): Payer: Medicare Other

## 2023-09-06 ENCOUNTER — Other Ambulatory Visit: Payer: Self-pay

## 2023-09-06 ENCOUNTER — Emergency Department (HOSPITAL_BASED_OUTPATIENT_CLINIC_OR_DEPARTMENT_OTHER)
Admission: EM | Admit: 2023-09-06 | Discharge: 2023-09-06 | Disposition: A | Discharge status: Home / Self Care | Payer: Medicare Other | Attending: Emergency Medicine | Admitting: Emergency Medicine

## 2023-09-06 ENCOUNTER — Encounter (HOSPITAL_BASED_OUTPATIENT_CLINIC_OR_DEPARTMENT_OTHER): Payer: Self-pay | Admitting: Emergency Medicine

## 2023-09-06 DIAGNOSIS — M25562 Pain in left knee: Secondary | ICD-10-CM | POA: Diagnosis not present

## 2023-09-06 DIAGNOSIS — Z79899 Other long term (current) drug therapy: Secondary | ICD-10-CM | POA: Diagnosis not present

## 2023-09-06 DIAGNOSIS — M1712 Unilateral primary osteoarthritis, left knee: Secondary | ICD-10-CM | POA: Diagnosis not present

## 2023-09-06 DIAGNOSIS — M7989 Other specified soft tissue disorders: Secondary | ICD-10-CM | POA: Diagnosis not present

## 2023-09-06 MED ORDER — MORPHINE SULFATE 15 MG PO TABS: 7.5000 mg | ORAL_TABLET | ORAL | 0 refills | Status: AC | PRN

## 2023-09-06 MED ORDER — KETOROLAC TROMETHAMINE 15 MG/ML IJ SOLN
15.0000 mg | Freq: Once | INTRAMUSCULAR | Status: AC
  Administered 2023-09-06: 15 mg via INTRAMUSCULAR
  Filled 2023-09-06: qty 1

## 2023-09-06 NOTE — ED Provider Notes (Signed)
 [facility] Provider Note   CSN: [id number] Arrival date & time: 09/06/23  1250     History  Chief Complaint  Patient presents with   Knee Pain    left    Tiffany Larsen is a 63 y.o. female.  20 y oF with a chief complaint of left knee pain.  The patient unfortunately had an issue with her right knee that required multiple knee surgeries.  She thinks that her left knee feels a bit like when she had had her meniscus torn.  She denies any specific injury but does feel like her knee gets stuck at times.  Has had a lot of pain with bearing weight trying to get up from a seated position.   Knee Pain      Home Medications Prior to Admission medications   Medication Sig Start Date End Date Taking? Authorizing Provider  morphine  (MSIR) 15 MG tablet Take 0.5 tablets (7.5 mg total) by mouth every 4 (four) hours as needed for severe pain (pain score 7-10). 09/06/23  Yes Albertus Hughs, DO  albuterol (PROVENTIL,VENTOLIN) 90 MCG/ACT inhaler Inhale 2 puffs into the lungs every 6 (six) hours as needed. For shortness of breath    [provider]  ALPRAZolam (XANAX) 0.5 MG tablet Take 0.5 mg by mouth 3 (three) times daily. 05/23/19   [provider]  amoxicillin -clavulanate (AUGMENTIN ) 875-125 MG tablet Take 1 tablet by mouth every 12 (twelve) hours. 06/22/19   Tonya Fredrickson, MD  atenolol-chlorthalidone (TENORETIC) 50-25 MG per tablet Take 1 tablet by mouth daily.      [provider]  atorvastatin (LIPITOR) 80 MG tablet Take 80 mg by mouth daily.    [provider]  baclofen (LIORESAL) 10 MG tablet Take 10 mg by mouth 2 (two) times daily as needed. 06/05/17   [provider]  cyclobenzaprine  (FLEXERIL ) 10 MG tablet Take 1 tablet (10 mg total) by mouth 2 (two) times daily as needed for muscle spasms. 01/03/23   Thomes Flicker, PA  cyclobenzaprine  (FLEXERIL ) 5 MG tablet  06/20/19   [provider]   diphenhydrAMINE  (BENADRYL ) 25 MG tablet Take 50 mg by mouth every 6 (six) hours as needed. For allergies    [provider]  fluticasone  (FLONASE ) 50 MCG/ACT nasal spray Place 2 sprays into both nostrils daily. 03/19/18   Jerilynn Montenegro, MD  folic acid  (FOLVITE ) 1 MG tablet Take 1 mg by mouth daily. 05/16/19   [provider]  levothyroxine (SYNTHROID, LEVOTHROID) 100 MCG tablet Take 100 mcg by mouth daily.    [provider]  meloxicam (MOBIC) 7.5 MG tablet Take 7.5 mg by mouth daily. 04/12/19   [provider]  NATURAL VITAMIN D-3 125 MCG (5000 UT) TABS Take 1 tablet by mouth daily. 04/17/19   [provider]  omeprazole (PRILOSEC) 40 MG capsule Take 40 mg by mouth daily. 05/08/19   [provider]  oxyCODONE -acetaminophen  (PERCOCET/ROXICET) 5-325 MG tablet Take 1 tablet by mouth every 6 (six) hours as needed for up to 8 doses for severe pain. 01/03/23   Thomes Flicker, PA  potassium chloride  SA (K-DUR,KLOR-CON ) 20 MEQ tablet Take 20 mEq by mouth daily.      [provider]  predniSONE  (STERAPRED UNI-PAK 21 TAB) 10 MG (21) TBPK tablet Take by mouth daily. Take 6 tabs by mouth daily  for 2 days, then 5 tabs for 2 days, then 4 tabs for 2 days, then 3 tabs for 2  days, 2 tabs for 2 days, then 1 tab by mouth daily for 2 days 01/03/23   Thomes Flicker, PA  vitamin B-12 (CYANOCOBALAMIN) 100 MCG tablet Take 100 mcg by mouth daily. 05/25/19   [provider]      Allergies    Shellfish allergy    Review of Systems   Review of Systems  Physical Exam Updated Vital Signs BP 121/69   Pulse 65   Temp 98.5 F (36.9 C) (Oral)   Resp 18   Wt 90.7 kg   SpO2 97%   BMI 36.00 kg/m  Physical Exam Vitals and nursing note reviewed.  Constitutional:      General: She is not in acute distress.    Appearance: She is well-developed. She is not diaphoretic.  HENT:     Head: Normocephalic and atraumatic.  Eyes:     Pupils: Pupils are equal, round, and  reactive to light.  Cardiovascular:     Rate and Rhythm: Normal rate and regular rhythm.     Heart sounds: No murmur heard.    No friction rub. No gallop.  Pulmonary:     Effort: Pulmonary effort is normal.     Breath sounds: No wheezing or rales.  Abdominal:     General: There is no distension.     Palpations: Abdomen is soft.     Tenderness: There is no abdominal tenderness.  Musculoskeletal:        General: No tenderness.     Cervical back: Normal range of motion and neck supple.     Comments: Some pain with range of motion of the knee.  Perhaps some laxity when put under varus stress.  Pulse motor and sensation intact distally.  Skin:    General: Skin is warm and dry.  Neurological:     Mental Status: She is alert and oriented to person, place, and time.  Psychiatric:        Behavior: Behavior normal.     ED Results / Procedures / Treatments   Labs (all labs ordered are listed, but only abnormal results are displayed) Labs Reviewed - No data to display  EKG None  Radiology No results found.  Procedures Procedures    Medications Ordered in ED Medications  ketorolac  (TORADOL ) 15 MG/ML injection 15 mg (has no administration in time range)    ED Course/ Medical Decision Making/ A&P                                 Medical Decision Making Amount and/or Complexity of Data Reviewed Radiology: ordered.  Risk Prescription drug management.   63 yo F with a chief complaints of left knee pain.  The patient thinks it is like when she had torn her meniscus on the right knee.  I agree her history sounds concerning for a meniscal injury.  Difficult to obtain McMurray's due to discomfort.  Plain film of the left knee independently interpreted by me without fracture or dislocation.  Will place in a knee immobilizer crutches orthopedic follow-up.  2:36 PM:  I have discussed the diagnosis/risks/treatment options with the patient.  Evaluation and diagnostic testing in the  emergency department does not suggest an emergent condition requiring admission or immediate intervention beyond what has been performed at this time.  They will follow up with Ortho. We also discussed returning to the ED immediately if new or worsening sx occur. We discussed the sx which are  most concerning (e.g., sudden worsening pain, fever, inability to tolerate by mouth) that necessitate immediate return. Medications administered to the patient during their visit and any new prescriptions provided to the patient are listed below.  Medications given during this visit Medications  ketorolac  (TORADOL ) 15 MG/ML injection 15 mg (has no administration in time range)     The patient appears reasonably screen and/or stabilized for discharge and I doubt any other medical condition or other [facility] requiring further screening, evaluation, or treatment in the ED at this time prior to discharge.          Final Clinical Impression(s) / ED Diagnoses Final diagnoses:  Acute pain of left knee    Rx / DC Orders ED Discharge Orders          Ordered    morphine  (MSIR) 15 MG tablet  Every 4 hours PRN        09/06/23 1434              Albertus Hughs, DO 09/06/23 1436

## 2023-09-06 NOTE — ED Triage Notes (Signed)
 Non traumatic Left knee started 2 weeks ago , felt worse yesterday , painful ambulation .  Hx surgery omn right knee .

## 2023-09-06 NOTE — Discharge Instructions (Addendum)
 Take 4 over the counter ibuprofen tablets 3 times a day or 2 over-the-counter naproxen tablets twice a day for pain. Also take tylenol 1000mg (2 extra strength) four times a day.   Then take the pain medicine if you feel like you need it. Narcotics do not help with the pain, they only make you care about it less.  You can become addicted to this, people may break into your house to steal it.  It will constipate you.  If you drive under the influence of this medicine you can get a DUI.

## 2023-09-14 DIAGNOSIS — S83242D Other tear of medial meniscus, current injury, left knee, subsequent encounter: Secondary | ICD-10-CM | POA: Diagnosis not present

## 2023-09-22 DIAGNOSIS — M10071 Idiopathic gout, right ankle and foot: Secondary | ICD-10-CM | POA: Diagnosis not present

## 2023-09-22 DIAGNOSIS — F419 Anxiety disorder, unspecified: Secondary | ICD-10-CM | POA: Diagnosis not present

## 2023-09-22 DIAGNOSIS — J0100 Acute maxillary sinusitis, unspecified: Secondary | ICD-10-CM | POA: Diagnosis not present

## 2023-09-22 DIAGNOSIS — G4709 Other insomnia: Secondary | ICD-10-CM | POA: Diagnosis not present

## 2023-09-22 DIAGNOSIS — E559 Vitamin D deficiency, unspecified: Secondary | ICD-10-CM | POA: Diagnosis not present

## 2023-09-22 DIAGNOSIS — G43909 Migraine, unspecified, not intractable, without status migrainosus: Secondary | ICD-10-CM | POA: Diagnosis not present

## 2023-09-22 DIAGNOSIS — J302 Other seasonal allergic rhinitis: Secondary | ICD-10-CM | POA: Diagnosis not present

## 2023-09-22 DIAGNOSIS — E785 Hyperlipidemia, unspecified: Secondary | ICD-10-CM | POA: Diagnosis not present

## 2023-09-22 DIAGNOSIS — E039 Hypothyroidism, unspecified: Secondary | ICD-10-CM | POA: Diagnosis not present

## 2023-09-22 DIAGNOSIS — K219 Gastro-esophageal reflux disease without esophagitis: Secondary | ICD-10-CM | POA: Diagnosis not present

## 2023-09-22 DIAGNOSIS — E119 Type 2 diabetes mellitus without complications: Secondary | ICD-10-CM | POA: Diagnosis not present

## 2023-09-22 DIAGNOSIS — I10 Essential (primary) hypertension: Secondary | ICD-10-CM | POA: Diagnosis not present

## 2023-10-20 DIAGNOSIS — J302 Other seasonal allergic rhinitis: Secondary | ICD-10-CM | POA: Diagnosis not present

## 2023-10-20 DIAGNOSIS — G43909 Migraine, unspecified, not intractable, without status migrainosus: Secondary | ICD-10-CM | POA: Diagnosis not present

## 2023-10-20 DIAGNOSIS — I10 Essential (primary) hypertension: Secondary | ICD-10-CM | POA: Diagnosis not present

## 2023-10-20 DIAGNOSIS — E559 Vitamin D deficiency, unspecified: Secondary | ICD-10-CM | POA: Diagnosis not present

## 2023-10-20 DIAGNOSIS — M10071 Idiopathic gout, right ankle and foot: Secondary | ICD-10-CM | POA: Diagnosis not present

## 2023-10-20 DIAGNOSIS — G4709 Other insomnia: Secondary | ICD-10-CM | POA: Diagnosis not present

## 2023-10-20 DIAGNOSIS — K219 Gastro-esophageal reflux disease without esophagitis: Secondary | ICD-10-CM | POA: Diagnosis not present

## 2023-10-20 DIAGNOSIS — E785 Hyperlipidemia, unspecified: Secondary | ICD-10-CM | POA: Diagnosis not present

## 2023-10-20 DIAGNOSIS — F419 Anxiety disorder, unspecified: Secondary | ICD-10-CM | POA: Diagnosis not present

## 2023-10-20 DIAGNOSIS — E039 Hypothyroidism, unspecified: Secondary | ICD-10-CM | POA: Diagnosis not present

## 2023-10-20 DIAGNOSIS — E119 Type 2 diabetes mellitus without complications: Secondary | ICD-10-CM | POA: Diagnosis not present

## 2023-11-03 DIAGNOSIS — H524 Presbyopia: Secondary | ICD-10-CM | POA: Diagnosis not present

## 2023-11-03 DIAGNOSIS — H04123 Dry eye syndrome of bilateral lacrimal glands: Secondary | ICD-10-CM | POA: Diagnosis not present

## 2023-11-03 DIAGNOSIS — H52203 Unspecified astigmatism, bilateral: Secondary | ICD-10-CM | POA: Diagnosis not present

## 2023-11-03 DIAGNOSIS — H43393 Other vitreous opacities, bilateral: Secondary | ICD-10-CM | POA: Diagnosis not present

## 2023-11-03 DIAGNOSIS — H2513 Age-related nuclear cataract, bilateral: Secondary | ICD-10-CM | POA: Diagnosis not present

## 2023-11-03 DIAGNOSIS — H5212 Myopia, left eye: Secondary | ICD-10-CM | POA: Diagnosis not present

## 2023-11-03 DIAGNOSIS — E119 Type 2 diabetes mellitus without complications: Secondary | ICD-10-CM | POA: Diagnosis not present

## 2023-11-05 DIAGNOSIS — S83242A Other tear of medial meniscus, current injury, left knee, initial encounter: Secondary | ICD-10-CM | POA: Diagnosis not present

## 2023-11-05 DIAGNOSIS — M65862 Other synovitis and tenosynovitis, left lower leg: Secondary | ICD-10-CM | POA: Diagnosis not present

## 2023-11-17 DIAGNOSIS — Z4789 Encounter for other orthopedic aftercare: Secondary | ICD-10-CM | POA: Diagnosis not present

## 2023-11-23 DIAGNOSIS — R072 Precordial pain: Secondary | ICD-10-CM | POA: Diagnosis not present

## 2023-11-23 DIAGNOSIS — R0789 Other chest pain: Secondary | ICD-10-CM | POA: Diagnosis not present

## 2023-11-23 DIAGNOSIS — E876 Hypokalemia: Secondary | ICD-10-CM | POA: Diagnosis not present

## 2023-11-24 DIAGNOSIS — R9431 Abnormal electrocardiogram [ECG] [EKG]: Secondary | ICD-10-CM | POA: Diagnosis not present

## 2023-11-24 DIAGNOSIS — M542 Cervicalgia: Secondary | ICD-10-CM | POA: Diagnosis not present

## 2023-11-24 DIAGNOSIS — S161XXD Strain of muscle, fascia and tendon at neck level, subsequent encounter: Secondary | ICD-10-CM | POA: Diagnosis not present

## 2023-11-26 DIAGNOSIS — R1013 Epigastric pain: Secondary | ICD-10-CM | POA: Diagnosis not present

## 2023-11-26 DIAGNOSIS — K30 Functional dyspepsia: Secondary | ICD-10-CM | POA: Diagnosis not present

## 2023-11-26 DIAGNOSIS — L299 Pruritus, unspecified: Secondary | ICD-10-CM | POA: Diagnosis not present

## 2023-11-26 DIAGNOSIS — R09A2 Foreign body sensation, throat: Secondary | ICD-10-CM | POA: Diagnosis not present

## 2023-12-01 DIAGNOSIS — M542 Cervicalgia: Secondary | ICD-10-CM | POA: Diagnosis not present

## 2023-12-13 DIAGNOSIS — M9903 Segmental and somatic dysfunction of lumbar region: Secondary | ICD-10-CM | POA: Diagnosis not present

## 2023-12-13 DIAGNOSIS — M9902 Segmental and somatic dysfunction of thoracic region: Secondary | ICD-10-CM | POA: Diagnosis not present

## 2023-12-13 DIAGNOSIS — G542 Cervical root disorders, not elsewhere classified: Secondary | ICD-10-CM | POA: Diagnosis not present

## 2023-12-13 DIAGNOSIS — M9901 Segmental and somatic dysfunction of cervical region: Secondary | ICD-10-CM | POA: Diagnosis not present

## 2023-12-15 DIAGNOSIS — M9903 Segmental and somatic dysfunction of lumbar region: Secondary | ICD-10-CM | POA: Diagnosis not present

## 2023-12-15 DIAGNOSIS — E119 Type 2 diabetes mellitus without complications: Secondary | ICD-10-CM | POA: Diagnosis not present

## 2023-12-15 DIAGNOSIS — G542 Cervical root disorders, not elsewhere classified: Secondary | ICD-10-CM | POA: Diagnosis not present

## 2023-12-15 DIAGNOSIS — E785 Hyperlipidemia, unspecified: Secondary | ICD-10-CM | POA: Diagnosis not present

## 2023-12-15 DIAGNOSIS — G43909 Migraine, unspecified, not intractable, without status migrainosus: Secondary | ICD-10-CM | POA: Diagnosis not present

## 2023-12-15 DIAGNOSIS — G4709 Other insomnia: Secondary | ICD-10-CM | POA: Diagnosis not present

## 2023-12-15 DIAGNOSIS — F419 Anxiety disorder, unspecified: Secondary | ICD-10-CM | POA: Diagnosis not present

## 2023-12-15 DIAGNOSIS — J302 Other seasonal allergic rhinitis: Secondary | ICD-10-CM | POA: Diagnosis not present

## 2023-12-15 DIAGNOSIS — K219 Gastro-esophageal reflux disease without esophagitis: Secondary | ICD-10-CM | POA: Diagnosis not present

## 2023-12-15 DIAGNOSIS — Z5181 Encounter for therapeutic drug level monitoring: Secondary | ICD-10-CM | POA: Diagnosis not present

## 2023-12-15 DIAGNOSIS — E559 Vitamin D deficiency, unspecified: Secondary | ICD-10-CM | POA: Diagnosis not present

## 2023-12-15 DIAGNOSIS — I10 Essential (primary) hypertension: Secondary | ICD-10-CM | POA: Diagnosis not present

## 2023-12-15 DIAGNOSIS — M9902 Segmental and somatic dysfunction of thoracic region: Secondary | ICD-10-CM | POA: Diagnosis not present

## 2023-12-15 DIAGNOSIS — M62838 Other muscle spasm: Secondary | ICD-10-CM | POA: Diagnosis not present

## 2023-12-15 DIAGNOSIS — E039 Hypothyroidism, unspecified: Secondary | ICD-10-CM | POA: Diagnosis not present

## 2023-12-15 DIAGNOSIS — M9901 Segmental and somatic dysfunction of cervical region: Secondary | ICD-10-CM | POA: Diagnosis not present

## 2023-12-16 DIAGNOSIS — G542 Cervical root disorders, not elsewhere classified: Secondary | ICD-10-CM | POA: Diagnosis not present

## 2023-12-16 DIAGNOSIS — Z4789 Encounter for other orthopedic aftercare: Secondary | ICD-10-CM | POA: Diagnosis not present

## 2023-12-16 DIAGNOSIS — S83242D Other tear of medial meniscus, current injury, left knee, subsequent encounter: Secondary | ICD-10-CM | POA: Diagnosis not present

## 2023-12-16 DIAGNOSIS — M9903 Segmental and somatic dysfunction of lumbar region: Secondary | ICD-10-CM | POA: Diagnosis not present

## 2023-12-16 DIAGNOSIS — M9902 Segmental and somatic dysfunction of thoracic region: Secondary | ICD-10-CM | POA: Diagnosis not present

## 2023-12-16 DIAGNOSIS — M9901 Segmental and somatic dysfunction of cervical region: Secondary | ICD-10-CM | POA: Diagnosis not present

## 2024-01-05 DIAGNOSIS — F331 Major depressive disorder, recurrent, moderate: Secondary | ICD-10-CM | POA: Diagnosis not present

## 2024-01-05 DIAGNOSIS — F411 Generalized anxiety disorder: Secondary | ICD-10-CM | POA: Diagnosis not present

## 2024-01-13 DIAGNOSIS — G544 Lumbosacral root disorders, not elsewhere classified: Secondary | ICD-10-CM | POA: Diagnosis not present

## 2024-01-13 DIAGNOSIS — M9905 Segmental and somatic dysfunction of pelvic region: Secondary | ICD-10-CM | POA: Diagnosis not present

## 2024-01-13 DIAGNOSIS — M9903 Segmental and somatic dysfunction of lumbar region: Secondary | ICD-10-CM | POA: Diagnosis not present

## 2024-01-13 DIAGNOSIS — M9904 Segmental and somatic dysfunction of sacral region: Secondary | ICD-10-CM | POA: Diagnosis not present

## 2024-01-18 DIAGNOSIS — J329 Chronic sinusitis, unspecified: Secondary | ICD-10-CM | POA: Diagnosis not present

## 2024-01-18 DIAGNOSIS — J40 Bronchitis, not specified as acute or chronic: Secondary | ICD-10-CM | POA: Diagnosis not present

## 2024-01-19 DIAGNOSIS — F411 Generalized anxiety disorder: Secondary | ICD-10-CM | POA: Diagnosis not present

## 2024-01-19 DIAGNOSIS — F331 Major depressive disorder, recurrent, moderate: Secondary | ICD-10-CM | POA: Diagnosis not present

## 2024-02-01 DIAGNOSIS — F411 Generalized anxiety disorder: Secondary | ICD-10-CM | POA: Diagnosis not present

## 2024-02-01 DIAGNOSIS — F331 Major depressive disorder, recurrent, moderate: Secondary | ICD-10-CM | POA: Diagnosis not present

## 2024-02-08 DIAGNOSIS — F331 Major depressive disorder, recurrent, moderate: Secondary | ICD-10-CM | POA: Diagnosis not present

## 2024-02-08 DIAGNOSIS — F411 Generalized anxiety disorder: Secondary | ICD-10-CM | POA: Diagnosis not present

## 2024-02-22 DIAGNOSIS — F411 Generalized anxiety disorder: Secondary | ICD-10-CM | POA: Diagnosis not present

## 2024-02-22 DIAGNOSIS — F331 Major depressive disorder, recurrent, moderate: Secondary | ICD-10-CM | POA: Diagnosis not present

## 2024-03-02 DIAGNOSIS — J069 Acute upper respiratory infection, unspecified: Secondary | ICD-10-CM | POA: Diagnosis not present

## 2024-03-05 DIAGNOSIS — R059 Cough, unspecified: Secondary | ICD-10-CM | POA: Diagnosis not present

## 2024-03-05 DIAGNOSIS — U071 COVID-19: Secondary | ICD-10-CM | POA: Diagnosis not present

## 2024-03-22 DIAGNOSIS — E119 Type 2 diabetes mellitus without complications: Secondary | ICD-10-CM | POA: Diagnosis not present

## 2024-03-22 DIAGNOSIS — E559 Vitamin D deficiency, unspecified: Secondary | ICD-10-CM | POA: Diagnosis not present

## 2024-03-22 DIAGNOSIS — J0100 Acute maxillary sinusitis, unspecified: Secondary | ICD-10-CM | POA: Diagnosis not present

## 2024-03-22 DIAGNOSIS — J302 Other seasonal allergic rhinitis: Secondary | ICD-10-CM | POA: Diagnosis not present

## 2024-03-22 DIAGNOSIS — Z5181 Encounter for therapeutic drug level monitoring: Secondary | ICD-10-CM | POA: Diagnosis not present

## 2024-03-22 DIAGNOSIS — E039 Hypothyroidism, unspecified: Secondary | ICD-10-CM | POA: Diagnosis not present

## 2024-03-22 DIAGNOSIS — R051 Acute cough: Secondary | ICD-10-CM | POA: Diagnosis not present

## 2024-03-22 DIAGNOSIS — G43909 Migraine, unspecified, not intractable, without status migrainosus: Secondary | ICD-10-CM | POA: Diagnosis not present

## 2024-03-22 DIAGNOSIS — Z1231 Encounter for screening mammogram for malignant neoplasm of breast: Secondary | ICD-10-CM | POA: Diagnosis not present

## 2024-03-22 DIAGNOSIS — K219 Gastro-esophageal reflux disease without esophagitis: Secondary | ICD-10-CM | POA: Diagnosis not present

## 2024-03-22 DIAGNOSIS — I10 Essential (primary) hypertension: Secondary | ICD-10-CM | POA: Diagnosis not present

## 2024-03-22 DIAGNOSIS — F419 Anxiety disorder, unspecified: Secondary | ICD-10-CM | POA: Diagnosis not present

## 2024-03-22 DIAGNOSIS — E785 Hyperlipidemia, unspecified: Secondary | ICD-10-CM | POA: Diagnosis not present

## 2024-03-22 DIAGNOSIS — G4709 Other insomnia: Secondary | ICD-10-CM | POA: Diagnosis not present

## 2024-05-28 DIAGNOSIS — J019 Acute sinusitis, unspecified: Secondary | ICD-10-CM | POA: Diagnosis not present

## 2024-05-28 DIAGNOSIS — B9689 Other specified bacterial agents as the cause of diseases classified elsewhere: Secondary | ICD-10-CM | POA: Diagnosis not present

## 2024-06-02 DIAGNOSIS — Z23 Encounter for immunization: Secondary | ICD-10-CM | POA: Diagnosis not present

## 2024-06-21 DIAGNOSIS — E785 Hyperlipidemia, unspecified: Secondary | ICD-10-CM | POA: Diagnosis not present

## 2024-06-21 DIAGNOSIS — J302 Other seasonal allergic rhinitis: Secondary | ICD-10-CM | POA: Diagnosis not present

## 2024-06-21 DIAGNOSIS — G4709 Other insomnia: Secondary | ICD-10-CM | POA: Diagnosis not present

## 2024-06-21 DIAGNOSIS — N289 Disorder of kidney and ureter, unspecified: Secondary | ICD-10-CM | POA: Diagnosis not present

## 2024-06-21 DIAGNOSIS — G43909 Migraine, unspecified, not intractable, without status migrainosus: Secondary | ICD-10-CM | POA: Diagnosis not present

## 2024-06-21 DIAGNOSIS — Z5181 Encounter for therapeutic drug level monitoring: Secondary | ICD-10-CM | POA: Diagnosis not present

## 2024-06-21 DIAGNOSIS — E119 Type 2 diabetes mellitus without complications: Secondary | ICD-10-CM | POA: Diagnosis not present

## 2024-06-21 DIAGNOSIS — F419 Anxiety disorder, unspecified: Secondary | ICD-10-CM | POA: Diagnosis not present

## 2024-06-21 DIAGNOSIS — I10 Essential (primary) hypertension: Secondary | ICD-10-CM | POA: Diagnosis not present

## 2024-06-21 DIAGNOSIS — K219 Gastro-esophageal reflux disease without esophagitis: Secondary | ICD-10-CM | POA: Diagnosis not present

## 2024-06-21 DIAGNOSIS — E039 Hypothyroidism, unspecified: Secondary | ICD-10-CM | POA: Diagnosis not present
